# Patient Record
Sex: Male | Born: 1945
Health system: Southern US, Community
[De-identification: ages and names within clinical notes are randomized; demographics above are authoritative.]

## PROBLEM LIST (undated history)

## (undated) DIAGNOSIS — N400 Enlarged prostate without lower urinary tract symptoms: Secondary | ICD-10-CM

## (undated) DIAGNOSIS — E785 Hyperlipidemia, unspecified: Secondary | ICD-10-CM

## (undated) DIAGNOSIS — F4310 Post-traumatic stress disorder, unspecified: Secondary | ICD-10-CM

## (undated) DIAGNOSIS — K5792 Diverticulitis of intestine, part unspecified, without perforation or abscess without bleeding: Secondary | ICD-10-CM

## (undated) DIAGNOSIS — G4700 Insomnia, unspecified: Secondary | ICD-10-CM

## (undated) DIAGNOSIS — G4730 Sleep apnea, unspecified: Secondary | ICD-10-CM

## (undated) HISTORY — PX: OTHER SURGICAL HISTORY: SHX169

## (undated) HISTORY — PX: FASCIOTOMY: SHX132

## (undated) HISTORY — DX: Diverticulitis of intestine, part unspecified, without perforation or abscess without bleeding: K57.92

## (undated) HISTORY — PX: DENTAL SURGERY: SHX609

## (undated) HISTORY — PX: HERNIA REPAIR: SHX51

## (undated) HISTORY — PX: BLEPHAROPLASTY: SUR158

---
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
## 1980-06-30 DIAGNOSIS — M726 Necrotizing fasciitis: Secondary | ICD-10-CM | POA: Insufficient documentation

## 2011-03-04 ENCOUNTER — Ambulatory Visit: Payer: Self-pay | Admitting: Family Medicine

## 2011-05-10 ENCOUNTER — Emergency Department: Payer: Self-pay | Admitting: Emergency Medicine

## 2011-12-17 ENCOUNTER — Emergency Department: Payer: Self-pay | Admitting: Internal Medicine

## 2011-12-17 ENCOUNTER — Ambulatory Visit: Payer: Self-pay | Admitting: Family Medicine

## 2011-12-17 LAB — URINALYSIS, COMPLETE
Bilirubin,UR: NEGATIVE
Glucose,UR: NEGATIVE mg/dL (ref 0–75)
Ketone: NEGATIVE
Leukocyte Esterase: NEGATIVE
Protein: 30
RBC,UR: 2 /HPF (ref 0–5)
Specific Gravity: 1.029 (ref 1.003–1.030)
Squamous Epithelial: <1
WBC UR: 1 /HPF (ref 0–5)

## 2011-12-17 LAB — CBC WITH DIFFERENTIAL/PLATELET
Basophil #: 0 10*3/uL (ref 0.0–0.1)
Eosinophil #: 0 10*3/uL (ref 0.0–0.7)
Eosinophil %: 0.1 %
HCT: 43.3 % (ref 40.0–52.0)
HGB: 14.9 g/dL (ref 12.0–16.0)
Lymphocyte #: 0.3 10*3/uL — ABNORMAL LOW (ref 1.0–3.6)
Lymphocyte %: 5 %
MCH: 32 pg (ref 26.0–34.0)
Monocyte %: 6.7 %
Neutrophil %: 87.8 %
RBC: 4.66 10*6/uL (ref 4.40–5.90)
RDW: 14.2 % (ref 11.5–14.5)
WBC: 6 10*3/uL (ref 3.8–10.6)

## 2011-12-17 LAB — BASIC METABOLIC PANEL
Anion Gap: 8 (ref 7–16)
BUN: 20 mg/dL — ABNORMAL HIGH (ref 7–18)
Calcium, Total: 8.7 mg/dL (ref 8.5–10.1)
Creatinine: 1.39 mg/dL — ABNORMAL HIGH (ref 0.60–1.30)
Glucose: 121 mg/dL — ABNORMAL HIGH (ref 65–99)
Osmolality: 274 (ref 275–301)
Sodium: 135 mmol/L — ABNORMAL LOW (ref 136–145)

## 2011-12-17 LAB — HEPATIC FUNCTION PANEL A (ARMC)
Albumin: 3.6 g/dL (ref 3.4–5.0)
Alkaline Phosphatase: 74 U/L (ref 50–136)
Bilirubin, Direct: <0.1 mg/dL (ref 0.00–0.20)
SGPT (ALT): 50 U/L
Total Protein: 7.5 g/dL (ref 6.4–8.2)

## 2011-12-19 LAB — URINE CULTURE

## 2011-12-23 LAB — CULTURE, BLOOD (SINGLE)

## 2012-10-29 IMAGING — CR DG CHEST 1V
1 series · 1 of 1 positions shown · non-contrast
Comparison: none

REASON FOR EXAM: fever
COMMENTS:

PROCEDURE:     DXR - DXR CHEST 1 VIEWAP OR PA  - December 17, 2011  [DATE]
RESULT:     The lungs are adequately inflated. There is no focal infiltrate.
The cardiac silhouette is mildly enlarged. The central pulmonary vascularity
is prominent. I see no pleural effusion.

[ap]
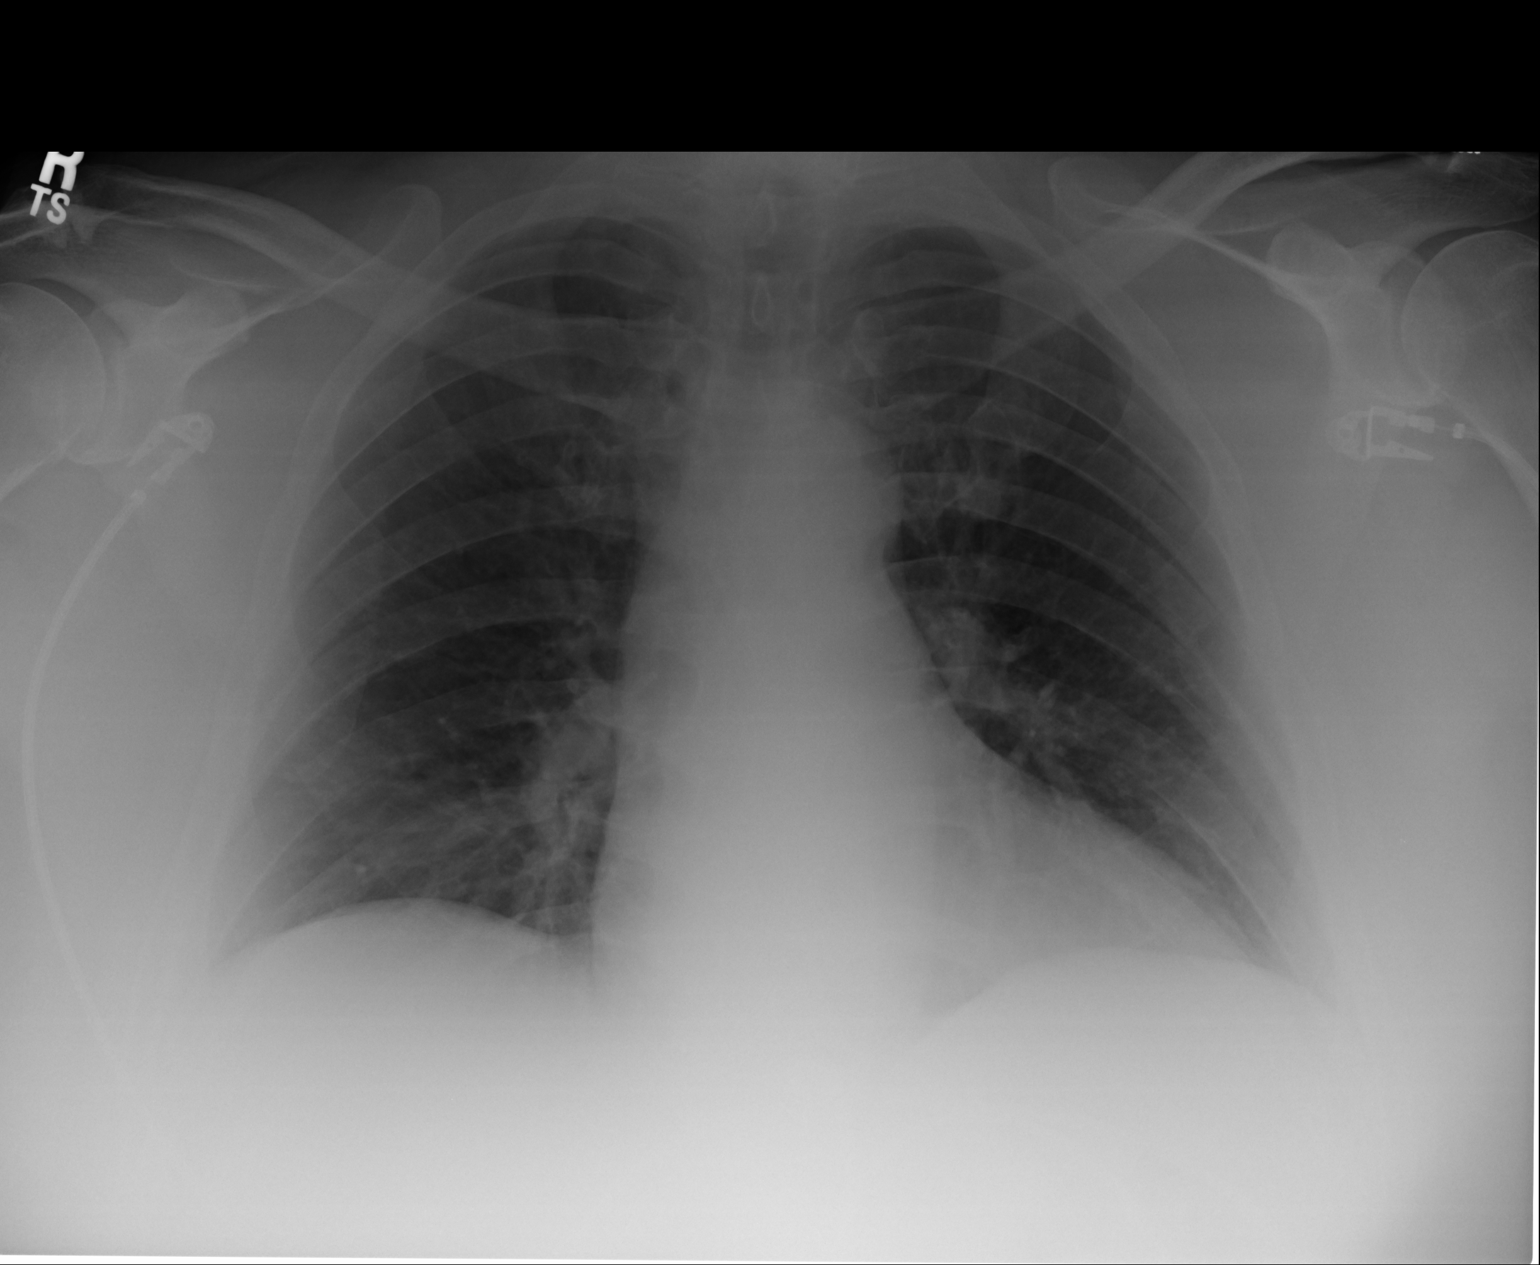

[1 of 1 positions shown; findings below may reference images not displayed]

IMPRESSION: I do not see evidence of pneumonia. I cannot exclude
low-grade CHF in the appropriate clinical setting. A followup PA and lateral
chest x-ray would be of value.

[REDACTED]

## 2017-05-23 ENCOUNTER — Emergency Department: Payer: Medicare HMO

## 2017-05-23 ENCOUNTER — Encounter: Payer: Self-pay | Admitting: Emergency Medicine

## 2017-05-23 ENCOUNTER — Observation Stay
Admission: EM | Admit: 2017-05-23 | Discharge: 2017-05-24 | Disposition: A | Discharge status: Home / Self Care | Payer: Medicare HMO | Attending: Internal Medicine | Admitting: Internal Medicine

## 2017-05-23 ENCOUNTER — Other Ambulatory Visit: Payer: Self-pay

## 2017-05-23 DIAGNOSIS — Z8249 Family history of ischemic heart disease and other diseases of the circulatory system: Secondary | ICD-10-CM | POA: Diagnosis not present

## 2017-05-23 DIAGNOSIS — R079 Chest pain, unspecified: Secondary | ICD-10-CM | POA: Diagnosis not present

## 2017-05-23 DIAGNOSIS — Z87891 Personal history of nicotine dependence: Secondary | ICD-10-CM | POA: Diagnosis not present

## 2017-05-23 DIAGNOSIS — G4700 Insomnia, unspecified: Secondary | ICD-10-CM | POA: Diagnosis not present

## 2017-05-23 DIAGNOSIS — N400 Enlarged prostate without lower urinary tract symptoms: Secondary | ICD-10-CM | POA: Insufficient documentation

## 2017-05-23 DIAGNOSIS — E785 Hyperlipidemia, unspecified: Secondary | ICD-10-CM | POA: Diagnosis not present

## 2017-05-23 DIAGNOSIS — Z6839 Body mass index (BMI) 39.0-39.9, adult: Secondary | ICD-10-CM | POA: Insufficient documentation

## 2017-05-23 DIAGNOSIS — E669 Obesity, unspecified: Secondary | ICD-10-CM | POA: Diagnosis not present

## 2017-05-23 DIAGNOSIS — I214 Non-ST elevation (NSTEMI) myocardial infarction: Secondary | ICD-10-CM | POA: Diagnosis not present

## 2017-05-23 DIAGNOSIS — R0789 Other chest pain: Secondary | ICD-10-CM | POA: Diagnosis not present

## 2017-05-23 DIAGNOSIS — Z7982 Long term (current) use of aspirin: Secondary | ICD-10-CM | POA: Insufficient documentation

## 2017-05-23 HISTORY — DX: Benign prostatic hyperplasia without lower urinary tract symptoms: N40.0

## 2017-05-23 HISTORY — DX: Insomnia, unspecified: G47.00

## 2017-05-23 HISTORY — DX: Hyperlipidemia, unspecified: E78.5

## 2017-05-23 LAB — BASIC METABOLIC PANEL
Anion gap: 7 (ref 5–15)
BUN: 16 mg/dL (ref 6–20)
CALCIUM: 9.2 mg/dL (ref 8.9–10.3)
CO2: 24 mmol/L (ref 22–32)
CREATININE: 1.23 mg/dL (ref 0.61–1.24)
Chloride: 107 mmol/L (ref 101–111)
GFR, EST NON AFRICAN AMERICAN: 57 mL/min — AB (ref >60)
Glucose, Bld: 115 mg/dL — ABNORMAL HIGH (ref 65–99)
Potassium: 4.3 mmol/L (ref 3.5–5.1)
SODIUM: 138 mmol/L (ref 135–145)

## 2017-05-23 LAB — CBC
HCT: 41.4 % (ref 40.0–52.0)
Hemoglobin: 14 g/dL (ref 13.0–18.0)
MCH: 31.8 pg (ref 26.0–34.0)
MCHC: 33.9 g/dL (ref 32.0–36.0)
MCV: 94 fL (ref 80.0–100.0)
PLATELETS: 231 10*3/uL (ref 150–440)
RBC: 4.4 MIL/uL (ref 4.40–5.90)
RDW: 13.6 % (ref 11.5–14.5)
WBC: 7.9 10*3/uL (ref 3.8–10.6)

## 2017-05-23 LAB — TROPONIN I
TROPONIN I: 0.03 ng/mL — AB (ref <0.03)
TROPONIN I: 0.03 ng/mL — AB (ref <0.03)

## 2017-05-23 LAB — FIBRIN DERIVATIVES D-DIMER (ARMC ONLY): Fibrin derivatives D-dimer (ARMC): 430.1 ng/mL (FEU) (ref 0.00–499.00)

## 2017-05-23 MED ORDER — LORATADINE 10 MG PO TABS: 10.0000 mg | ORAL_TABLET | Freq: Every day | ORAL | Status: DC | PRN

## 2017-05-23 MED ORDER — ASPIRIN EC 81 MG PO TBEC
81.0000 mg | DELAYED_RELEASE_TABLET | Freq: Every day | ORAL | Status: DC
  Administered 2017-05-24: 81 mg via ORAL
  Filled 2017-05-23: qty 1

## 2017-05-23 MED ORDER — ZOLPIDEM TARTRATE 5 MG PO TABS
5.0000 mg | ORAL_TABLET | Freq: Every day | ORAL | Status: DC
Start: 2017-05-23 — End: 2017-05-23

## 2017-05-23 MED ORDER — ACETAMINOPHEN 650 MG RE SUPP: 650.0000 mg | Freq: Four times a day (QID) | RECTAL | Status: DC | PRN

## 2017-05-23 MED ORDER — ASPIRIN 81 MG PO CHEW
324.0000 mg | CHEWABLE_TABLET | Freq: Once | ORAL | Status: AC
  Administered 2017-05-23: 324 mg via ORAL
  Filled 2017-05-23: qty 4

## 2017-05-23 MED ORDER — OXYCODONE HCL 5 MG PO TABS: 5.0000 mg | ORAL_TABLET | ORAL | Status: DC | PRN

## 2017-05-23 MED ORDER — ZOLPIDEM TARTRATE 5 MG PO TABS
5.0000 mg | ORAL_TABLET | Freq: Every day | ORAL | Status: DC
  Administered 2017-05-23: 5 mg via ORAL
  Filled 2017-05-23: qty 1

## 2017-05-23 MED ORDER — TAMSULOSIN HCL 0.4 MG PO CAPS
0.4000 mg | ORAL_CAPSULE | Freq: Every day | ORAL | Status: DC
  Administered 2017-05-23: 0.4 mg via ORAL
  Filled 2017-05-23: qty 1

## 2017-05-23 MED ORDER — ENOXAPARIN SODIUM 150 MG/ML ~~LOC~~ SOLN
1.0000 mg/kg | Freq: Once | SUBCUTANEOUS | Status: AC
  Administered 2017-05-23: 135 mg via SUBCUTANEOUS
  Filled 2017-05-23: qty 0.91

## 2017-05-23 MED ORDER — SODIUM CHLORIDE 0.9% FLUSH
3.0000 mL | Freq: Two times a day (BID) | INTRAVENOUS | Status: DC
  Administered 2017-05-23 – 2017-05-24 (×2): 3 mL via INTRAVENOUS

## 2017-05-23 MED ORDER — ACETAMINOPHEN 325 MG PO TABS: 650.0000 mg | ORAL_TABLET | Freq: Four times a day (QID) | ORAL | Status: DC | PRN

## 2017-05-23 MED ORDER — NITROGLYCERIN 0.4 MG SL SUBL
0.4000 mg | SUBLINGUAL_TABLET | Freq: Once | SUBLINGUAL | Status: AC
  Administered 2017-05-23: 0.4 mg via SUBLINGUAL
  Filled 2017-05-23: qty 1

## 2017-05-23 MED ORDER — MONTELUKAST SODIUM 10 MG PO TABS
10.0000 mg | ORAL_TABLET | Freq: Every day | ORAL | Status: DC
  Filled 2017-05-23: qty 1

## 2017-05-23 MED ORDER — KETOROLAC TROMETHAMINE 15 MG/ML IJ SOLN
15.0000 mg | Freq: Once | INTRAMUSCULAR | Status: AC
  Administered 2017-05-23: 15 mg via INTRAVENOUS
  Filled 2017-05-23: qty 1

## 2017-05-23 MED ORDER — NITROGLYCERIN 0.4 MG SL SUBL: 0.4000 mg | SUBLINGUAL_TABLET | SUBLINGUAL | Status: DC | PRN

## 2017-05-23 MED ORDER — FINASTERIDE 5 MG PO TABS
5.0000 mg | ORAL_TABLET | Freq: Every day | ORAL | Status: DC
  Filled 2017-05-23: qty 1

## 2017-05-23 NOTE — ED Notes (Signed)
Report received from [facility] - pt will be getting a 2nd troponin drawn at 1130. Pt resting comfortably in the room

## 2017-05-23 NOTE — ED Notes (Signed)
Cena BentonBarbara Hopkin (patient's wife)  [phone]

## 2017-05-23 NOTE — ED Notes (Signed)
Admitting dr in with pt  

## 2017-05-23 NOTE — Plan of Care (Signed)
No complaints of pain, will continue to assess and monitor.

## 2017-05-23 NOTE — H&P (Addendum)
[facility] [place] [facility]   PATIENT NAME: Brandon Wheeler    MR#:  [id number]  DATE OF BIRTH:  01-03-1946  DATE OF ADMISSION:  05/23/2017  PRIMARY CARE PHYSICIAN: Clare GandyEdelman, David, MD   REQUESTING/REFERRING PHYSICIAN: Dr Presley RaddleJohnathan Williams  CHIEF COMPLAINT:   Chief Complaint  Patient presents with  . Chest Pain    HISTORY OF PRESENT ILLNESS:  Brandon Wheeler  is a 71 y.o. male with a known history of hyperlipidemia.  He presented with chest pain that started this morning.  It has been constant since he has been here.  Described as sharp pain 8 out of 10 in intensity when he came in but eased off to about 4 out of 10 in intensity.  It hurts more when he moves or presses his left chest.  It feels like somebody hit him in the chest.  He feels a little lightheaded.  No nausea or vomiting.  No sweating.  No shortness of breath.  In the ER, his first troponin was less than 0.03.  His second troponin was 0.03.  He was bradycardic.  ER physician asked for observation.  Patient is usually very active and plays golf every day.  PAST MEDICAL HISTORY:   Past Medical History:  Diagnosis Date  . BPH (benign prostatic hyperplasia)   . Hyperlipidemia   . Insomnia     PAST SURGICAL HISTORY:   Past Surgical History:  Procedure Laterality Date  . FASCIOTOMY    . HERNIA REPAIR    . skin grafts      SOCIAL HISTORY:   Social History   Tobacco Use  . Smoking status: Former Games developermoker  . Smokeless tobacco: Never Used  Substance Use Topics  . Alcohol use: Yes    Comment: rare    FAMILY HISTORY:   Family History  Problem Relation Age of Onset  . Ovarian cancer Mother   . Hypertension Mother   . CAD Father     DRUG ALLERGIES:  No Known Allergies  REVIEW OF SYSTEMS:  CONSTITUTIONAL: No fever, fatigue or weakness.  EYES: No blurred or double vision.  Wears glasses.  Has small cataracts. EARS, NOSE, AND THROAT: No tinnitus or ear pain. No sore  throat RESPIRATORY: No cough, shortness of breath, wheezing or hemoptysis.  CARDIOVASCULAR: Some chest pain no . orthopnea, edema.  GASTROINTESTINAL: No nausea, vomiting, diarrhea or abdominal pain. No blood in bowel movements GENITOURINARY: No dysuria, hematuria.  ENDOCRINE: No polyuria, nocturia,  HEMATOLOGY: No anemia, easy bruising or bleeding SKIN: No rash or lesion. MUSCULOSKELETAL: Positive for joint pain NEUROLOGIC: No tingling, numbness, weakness.  PSYCHIATRY: No anxiety or depression.   MEDICATIONS AT HOME:   Prior to Admission medications   Medication Sig Start Date End Date Taking? Authorizing Provider  aspirin EC 81 MG tablet Take 81 mg by mouth daily.   Yes [provider]  zolpidem (AMBIEN) 5 MG tablet Take 5 mg by mouth at bedtime.   Yes [provider]   Patient also takes a cholesterol pill and a pill for his prostate. Medication reconciliation still undergoing.  Reproducible chest pain to palpation over the left upper chest.  VITAL SIGNS:  Blood pressure (!) 132/91, pulse (!) 50, temperature 97.8 F (36.6 C), temperature source Oral, resp. rate 15, height 6\' 1"  (1.854 m), weight (!) 136.5 kg (301 lb), SpO2 98 %.  PHYSICAL EXAMINATION:  GENERAL:  71 y.o.-year-old patient lying in the bed with no acute distress.  EYES: Pupils equal, round,  reactive to light and accommodation. No scleral icterus. Extraocular muscles intact.  HEENT: Head atraumatic, normocephalic. Oropharynx and nasopharynx clear.  NECK:  Supple, no jugular venous distention. No thyroid enlargement, no tenderness.  LUNGS: Normal breath sounds bilaterally, no wheezing, rales,rhonchi or crepitation. No use of accessory muscles of respiration.  CARDIOVASCULAR: S1, S2 normal. No murmurs, rubs, or gallops.  Reproducible chest pain to the left upper chest.  Patient also had pain when he moves his left arm. ABDOMEN: Soft, nontender, nondistended. Bowel sounds present. No organomegaly or  mass.  EXTREMITIES: Trace edema, no cyanosis, or clubbing.  NEUROLOGIC: Cranial nerves II through XII are intact. Muscle strength 5/5 in all extremities. Sensation intact. Gait not checked.  PSYCHIATRIC: The patient is alert and oriented x 3.  SKIN: No rash, lesion, or ulcer.   LABORATORY PANEL:   CBC Recent Labs  Lab 05/23/17 0939  WBC 7.9  HGB 14.0  HCT 41.4  PLT 231   ------------------------------------------------------------------------------------------------------------------  Chemistries  Recent Labs  Lab 05/23/17 0939  NA 138  K 4.3  CL 107  CO2 24  GLUCOSE 115*  BUN 16  CREATININE 1.23  CALCIUM 9.2   ------------------------------------------------------------------------------------------------------------------  Cardiac Enzymes Recent Labs  Lab 05/23/17 1128  TROPONINI 0.03*   ------------------------------------------------------------------------------------------------------------------  RADIOLOGY:  Dg Chest 2 View  Result Date: 05/23/2017 CLINICAL DATA:  71 year old male with a history of smoking EXAM: CHEST  2 VIEW COMPARISON:  None. FINDINGS: Cardiomediastinal silhouette unchanged. No evidence of central vascular congestion. No interlobular septal thickening. Coarsened interstitial markings similar to prior. No pneumothorax or pleural effusion. No displaced fracture IMPRESSION: No radiographic evidence of acute cardiopulmonary disease Electronically Signed   By: Gilmer MorJaime  Wagner D.O.   On: 05/23/2017 10:05    EKG:   Sinus bradycardia 44 bpm  IMPRESSION AND PLAN:   1.  Chest pain.  Will observe on telemetry.  Get a third cardiac enzyme.  Obtain echocardiogram.  Cardiology consultation.  Unfortunately no other testing can be done on Saturday afternoon or Sunday.  Continue aspirin.  Fiber and derivatives negatives are less likely pulmonary embolism.  I doubt aortic dissection. 2.  Hyperlipidemia unspecified check a lipid profile in the a.m. 3.   BPH on a medication for prostate. 4.  Insomnia on Ambien  All the records are reviewed and case discussed with ED provider. Management plans discussed with the patient, family and they are in agreement.  CODE STATUS: Full code  TOTAL TIME TAKING CARE OF THIS PATIENT: 50 minutes.    Alford Highlandichard Jaquetta Currier M.D on 05/23/2017 at 1:29 PM  Between 7am to 6pm - Pager - [phone]  After 6pm call admission pager [phone]  [facility] Office  [phone]  CC: Primary care physician; Clare GandyEdelman, David, MD

## 2017-05-23 NOTE — ED Notes (Signed)
Pt given lunch tray and is sitting at the side of the bed eating.

## 2017-05-23 NOTE — ED Notes (Signed)
Patient transported to X-ray 

## 2017-05-23 NOTE — ED Provider Notes (Addendum)
[facility] Emergency Department Provider Note       Time seen: ----------------------------------------- 9:39 AM on 05/23/2017 -----------------------------------------   I have reviewed the triage vital signs and the nursing notes.  HISTORY   Chief Complaint Chest Pain    HPI Brandon Wheeler is a 71 y.o. male with a history of hernia repair who presents to the ED for left-sided chest pain.  Patient reports around 8 AM he felt like someone punched him in the chest.  He is never felt this before, nothing makes it better.  He states it seems to come and go, it does worsen with deep breathing or certain movements.  He denies sweats, nausea or shortness of breath.  He was lightheaded.  History reviewed. No pertinent past medical history.  There are no active problems to display for this patient.   Past Surgical History:  Procedure Laterality Date  . FASCIOTOMY    . HERNIA REPAIR      Allergies Patient has no known allergies.  Social History Social History   Tobacco Use  . Smoking status: Former Games developermoker  . Smokeless tobacco: Never Used  Substance Use Topics  . Alcohol use: Yes    Comment: rare  . Drug use: Not on file    Review of Systems Constitutional: Negative for fever. Eyes: Negative for vision changes ENT:  Negative for congestion, sore throat Cardiovascular: Positive for chest pain Respiratory: Negative for shortness of breath Gastrointestinal: Negative for abdominal pain, vomiting and diarrhea. Genitourinary: Negative for dysuria. Musculoskeletal: Negative for back pain. Skin: Negative for rash. Neurological: Negative for headaches, focal weakness or numbness.  All systems negative/normal/unremarkable except as stated in the HPI  ____________________________________________   PHYSICAL EXAM:  VITAL SIGNS: ED Triage Vitals  Enc Vitals Group     BP 05/23/17 0935 (!) 146/64     Pulse Rate 05/23/17 0935 (!) 42     Resp  05/23/17 0935 (!) 22     Temp 05/23/17 0935 97.8 F (36.6 C)     Temp Source 05/23/17 0935 Oral     SpO2 05/23/17 0935 98 %     Weight 05/23/17 0929 (!) 301 lb (136.5 kg)     Height 05/23/17 0929 6\' 1"  (1.854 m)     Head Circumference --      Peak Flow --      Pain Score 05/23/17 0929 8     Pain Loc --      Pain Edu? --      Excl. in GC? --     Constitutional: Alert and oriented. Well appearing and in no distress. Eyes: Conjunctivae are normal. Normal extraocular movements. ENT   Head: Normocephalic and atraumatic.   Nose: No congestion/rhinnorhea.   Mouth/Throat: Mucous membranes are moist.   Neck: No stridor. Cardiovascular: Normal rate, regular rhythm. No murmurs, rubs, or gallops. Respiratory: Normal respiratory effort without tachypnea nor retractions. Breath sounds are clear and equal bilaterally. No wheezes/rales/rhonchi. Gastrointestinal: Soft and nontender. Normal bowel sounds Musculoskeletal: Nontender with normal range of motion in extremities. No lower extremity tenderness nor edema. Neurologic:  Normal speech and language. No gross focal neurologic deficits are appreciated.  Skin:  Skin is warm, dry and intact. No rash noted. Psychiatric: Mood and affect are normal. Speech and behavior are normal.  ____________________________________________  EKG: Interpreted by me.  Sinus bradycardia with a rate of 44 bpm, normal PR interval, normal QRS, normal QT.  ____________________________________________  ED COURSE:  Pertinent labs & imaging results that  were available during my care of the patient were reviewed by me and considered in my medical decision making (see chart for details). Patient presents for chest pain, we will assess with labs and imaging as indicated.   Procedures ____________________________________________   LABS (pertinent positives/negatives)  Labs Reviewed  BASIC METABOLIC PANEL - Abnormal; Notable for the following components:       Result Value   Glucose, Bld 115 (*)    GFR calc non Af Amer 57 (*)    All other components within normal limits  CBC  TROPONIN I  FIBRIN DERIVATIVES D-DIMER ([facility] ONLY)  TROPONIN I   CRITICAL CARE Performed by: Emily FilbertWilliams, Rita Vialpando E   Total critical care time: 30 minutes  Critical care time was exclusive of separately billable procedures and treating other patients.  Critical care was necessary to treat or prevent imminent or life-threatening deterioration.  Critical care was time spent personally by me on the following activities: development of treatment plan with patient and/or surrogate as well as nursing, discussions with consultants, evaluation of patient's response to treatment, examination of patient, obtaining history from patient or surrogate, ordering and performing treatments and interventions, ordering and review of laboratory studies, ordering and review of radiographic studies, pulse oximetry and re-evaluation of patient's condition.  RADIOLOGY Images were viewed by me  Chest x-ray IMPRESSION: No radiographic evidence of acute cardiopulmonary disease ____________________________________________  DIFFERENTIAL DIAGNOSIS   Unstable angina, MI, PE, pneumothorax, dissection, muscle strain, GERD, anxiety  FINAL ASSESSMENT AND PLAN  Chest pain   Plan: Patient had presented for sudden onset chest pain while he was drinking coffee this morning. Patient's labs were initially negative but repeat troponin was slightly elevated indicating he will need full cardiac evaluation and rule out.  He was given aspirin and a dose of nitroglycerin as well as shot of Lovenox.  Patient's imaging was negative for acute process.  I will discussed with the hospitalist for admission.   Emily FilbertWilliams, Journiee Feldkamp E, MD   Note: This note was generated in part or whole with voice recognition software. Voice recognition is usually quite accurate but there are transcription errors that can and  very often do occur. I apologize for any typographical errors that were not detected and corrected.     Emily FilbertWilliams, Biruk Troia E, MD 05/23/17 1243    Emily FilbertWilliams, Resa Rinks E, MD 05/23/17 1249

## 2017-05-23 NOTE — Plan of Care (Signed)
  Pain Managment: General experience of comfort will improve 05/23/2017 1811 - Progressing by Heath LarkKirkendall, Yaniris Braddock D, RN

## 2017-05-23 NOTE — ED Triage Notes (Addendum)
Patient to ER for c/o chest pain to left chest. States "It feels like someone punched me in the chest.". Patient arrives grabbing chest, ambulatory to stat desk. Patient denies any nausea, diaphoresis, or vomiting. +Light headedness and shortness of breath. Denies any cardiac history.

## 2017-05-24 DIAGNOSIS — R079 Chest pain, unspecified: Secondary | ICD-10-CM | POA: Diagnosis not present

## 2017-05-24 DIAGNOSIS — N400 Enlarged prostate without lower urinary tract symptoms: Secondary | ICD-10-CM | POA: Diagnosis not present

## 2017-05-24 DIAGNOSIS — G4700 Insomnia, unspecified: Secondary | ICD-10-CM | POA: Diagnosis not present

## 2017-05-24 DIAGNOSIS — E785 Hyperlipidemia, unspecified: Secondary | ICD-10-CM | POA: Diagnosis not present

## 2017-05-24 LAB — LIPID PANEL
Cholesterol: 93 mg/dL (ref 0–200)
HDL: 43 mg/dL (ref >40)
LDL CALC: 32 mg/dL (ref 0–99)
TRIGLYCERIDES: 90 mg/dL (ref <150)
Total CHOL/HDL Ratio: 2.2 RATIO
VLDL: 18 mg/dL (ref 0–40)

## 2017-05-24 LAB — CBC
HEMATOCRIT: 38.7 % — AB (ref 40.0–52.0)
Hemoglobin: 13.3 g/dL (ref 13.0–18.0)
MCH: 32.5 pg (ref 26.0–34.0)
MCHC: 34.4 g/dL (ref 32.0–36.0)
MCV: 94.5 fL (ref 80.0–100.0)
Platelets: 201 10*3/uL (ref 150–440)
RBC: 4.1 MIL/uL — ABNORMAL LOW (ref 4.40–5.90)
RDW: 13.5 % (ref 11.5–14.5)
WBC: 7.5 10*3/uL (ref 3.8–10.6)

## 2017-05-24 LAB — BASIC METABOLIC PANEL
Anion gap: 4 — ABNORMAL LOW (ref 5–15)
BUN: 20 mg/dL (ref 6–20)
CALCIUM: 8.6 mg/dL — AB (ref 8.9–10.3)
CO2: 28 mmol/L (ref 22–32)
Chloride: 106 mmol/L (ref 101–111)
Creatinine, Ser: 1.15 mg/dL (ref 0.61–1.24)
GFR calc Af Amer: >60 mL/min (ref >60)
GLUCOSE: 120 mg/dL — AB (ref 65–99)
POTASSIUM: 4.2 mmol/L (ref 3.5–5.1)
Sodium: 138 mmol/L (ref 135–145)

## 2017-05-24 NOTE — Plan of Care (Signed)
Na

## 2017-05-24 NOTE — Progress Notes (Signed)
Pt given discharge instructions. Tele off and IV out. Pt verbalized understanding with no further questions. Family at bedside, transported via wheelchair.

## 2017-05-24 NOTE — Consult Note (Addendum)
Primary Physician:  [facility]) Primary Cardiologist:  Asked to see by Dr Delano Metz for CP  HPI: Pt is a 71 yo with history of HL  Followed at [facility]   On a med, not sure what    Admittted yesterday with CP  Constant  Acute onset  Sharp  8/10   Worse with moving or pressing  No Fevers or chills  NO cough  Denies injury     Since yesterday pain has eased  Sill mild     FHX CAD father  Remote tobacco    Trop neg x 3   LDL 32  HDL 43  Total chol 93         Past Medical History:  Diagnosis Date  . BPH (benign prostatic hyperplasia)   . Hyperlipidemia   . Insomnia     Medications Prior to Admission  Medication Sig Dispense Refill  . aspirin EC 81 MG tablet Take 81 mg by mouth daily.    . cetirizine (ZYRTEC) 10 MG tablet Take 10 mg by mouth daily as needed for allergies.    . finasteride (PROSCAR) 5 MG tablet Take 5 mg by mouth daily.    . montelukast (SINGULAIR) 10 MG tablet Take 10 mg by mouth daily.    . tamsulosin (FLOMAX) 0.4 MG CAPS capsule Take 0.4 mg by mouth at bedtime.    Marland Kitchen zolpidem (AMBIEN) 10 MG tablet Take 10 mg by mouth at bedtime.        Marland Kitchen aspirin EC  81 mg Oral Daily  . finasteride  5 mg Oral Daily  . montelukast  10 mg Oral Daily  . sodium chloride flush  3 mL Intravenous Q12H  . tamsulosin  0.4 mg Oral QHS  . zolpidem  5 mg Oral QHS    Infusions:   No Known Allergies  Social History   Socioeconomic History  . Marital status: Married    Spouse name: Not on file  . Number of children: Not on file  . Years of education: Not on file  . Highest education level: Not on file  Social Needs  . Financial resource strain: Not on file  . Food insecurity - worry: Not on file  . Food insecurity - inability: Not on file  . Transportation needs - medical: Not on file  . Transportation needs - non-medical: Not on file  Occupational History  . Not on file  Tobacco Use  . Smoking status: Former Games developer  . Smokeless tobacco: Never Used    Substance and Sexual Activity  . Alcohol use: Yes    Comment: rare  . Drug use: No  . Sexual activity: Not on file  Other Topics Concern  . Not on file  Social History Narrative  . Not on file    Family History  Problem Relation Age of Onset  . Ovarian cancer Mother   . Hypertension Mother   . CAD Father     REVIEW OF SYSTEMS:  All systems reviewed  Negative to the above problem except as noted above.    PHYSICAL EXAM: Vitals:   05/24/17 0354 05/24/17 0811  BP: (!) 144/61 (!) 158/67  Pulse: (!) 48 (!) 49  Resp: 19 14  Temp: 98.1 F (36.7 C)   SpO2: 97% 98%     Intake/Output Summary (Last 24 hours) at 05/24/2017 1040 Last data filed at 05/24/2017 1029 Gross per 24 hour  Intake 363 ml  Output 800 ml  Net -437 ml  General:  Well appearing. No respiratory difficulty  Obese   HEENT: normal Neck: supple. no JVD. Carotids 2+ bilat; no bruits. No lymphadenopathy or thryomegaly appreciated. Cor: PMI nondisplaced. Regular rate & rhythm. No rubs, gallops or murmurs. Lungs: clear Abdomen: soft, nontender, nondistended. No hepatosplenomegaly. No bruits or masses. Good bowel sounds. Extremities: no cyanosis, clubbing, rash, edema Neuro: alert & oriented x 3, cranial nerves grossly intact. moves all 4 extremities w/o difficulty. Affect pleasant.  ECG:  Sinus bradycardia  44 bpm    Results for orders placed or performed during the hospital encounter of 05/23/17 (from the past 24 hour(s))  Troponin I     Status: Abnormal   Collection Time: 05/23/17 11:28 AM  Result Value Ref Range   Troponin I 0.03 (HH) <0.03 ng/mL  Fibrin derivatives D-Dimer ([facility] only)     Status: None   Collection Time: 05/23/17 11:28 AM  Result Value Ref Range   Fibrin derivatives D-dimer [facility]) 430.10 0.00 - 499.00 ng/mL (FEU)  Troponin I     Status: Abnormal   Collection Time: 05/23/17  3:24 PM  Result Value Ref Range   Troponin I 0.03 (HH) <0.03 ng/mL  Basic metabolic panel     Status:  Abnormal   Collection Time: 05/24/17  4:17 AM  Result Value Ref Range   Sodium 138 135 - 145 mmol/L   Potassium 4.2 3.5 - 5.1 mmol/L   Chloride 106 101 - 111 mmol/L   CO2 28 22 - 32 mmol/L   Glucose, Bld 120 (H) 65 - 99 mg/dL   BUN 20 6 - 20 mg/dL   Creatinine, Ser 1.191.15 0.61 - 1.24 mg/dL   Calcium 8.6 (L) 8.9 - 10.3 mg/dL   GFR calc non Af Amer >60 >60 mL/min   GFR calc Af Amer >60 >60 mL/min   Anion gap 4 (L) 5 - 15  CBC     Status: Abnormal   Collection Time: 05/24/17  4:17 AM  Result Value Ref Range   WBC 7.5 3.8 - 10.6 K/uL   RBC 4.10 (L) 4.40 - 5.90 MIL/uL   Hemoglobin 13.3 13.0 - 18.0 g/dL   HCT 14.738.7 (L) 82.940.0 - 56.252.0 %   MCV 94.5 80.0 - 100.0 fL   MCH 32.5 26.0 - 34.0 pg   MCHC 34.4 32.0 - 36.0 g/dL   RDW 13.013.5 86.511.5 - 78.414.5 %   Platelets 201 150 - 440 K/uL  Lipid panel     Status: None   Collection Time: 05/24/17  4:17 AM  Result Value Ref Range   Cholesterol 93 0 - 200 mg/dL   Triglycerides 90 <696<150 mg/dL   HDL 43 >29>40 mg/dL   Total CHOL/HDL Ratio 2.2 RATIO   VLDL 18 0 - 40 mg/dL   LDL Cholesterol 32 0 - 99 mg/dL   Dg Chest 2 View  Result Date: 05/23/2017 CLINICAL DATA:  71 year old male with a history of smoking EXAM: CHEST  2 VIEW COMPARISON:  None. FINDINGS: Cardiomediastinal silhouette unchanged. No evidence of central vascular congestion. No interlobular septal thickening. Coarsened interstitial markings similar to prior. No pneumothorax or pleural effusion. No displaced fracture IMPRESSION: No radiographic evidence of acute cardiopulmonary disease Electronically Signed   By: Gilmer MorJaime  Wagner D.O.   On: 05/23/2017 10:05     ASSESSMENT:  Pt is a 71 yo with history of HL by report on med Presents with CP  1.  CP  Atypical for anginia  Trop neg x 3  EKG neg  Probably  pleuritic or muscular in origin. Rx with pain meds, rest I would not plan any cardiac testing  D/c echo  2.  HL  [place] in [place]  Pt does not recall what med on   COtinue  3  Obesity  Discussed  wt loss  Encouraged more walking   OK to d/c from cardiac standpoint.

## 2017-05-24 NOTE — Plan of Care (Signed)
  Adequate for Discharge Education: Knowledge of General Education information will improve 05/24/2017 1239 - Adequate for Discharge by Erma HeritageAlejo Calderon, Desmund Elman, RN Health Behavior/Discharge Planning: Ability to manage health-related needs will improve 05/24/2017 1239 - Adequate for Discharge by Erma HeritageAlejo Calderon, Don Tiu, RN Clinical Measurements: Ability to maintain clinical measurements within normal limits will improve 05/24/2017 1239 - Adequate for Discharge by Weldon PickingAlejo Calderon, Manuella GhaziBerenice, RN Will remain free from infection 05/24/2017 1239 - Adequate for Discharge by Erma HeritageAlejo Calderon, Peytyn Trine, RN Diagnostic test results will improve 05/24/2017 1239 - Adequate for Discharge by Erma HeritageAlejo Calderon, Wayman Hoard, RN Respiratory complications will improve 05/24/2017 1239 - Adequate for Discharge by Erma HeritageAlejo Calderon, Zekiah Coen, RN Cardiovascular complication will be avoided 05/24/2017 1239 - Adequate for Discharge by Weldon PickingAlejo Calderon, Manuella GhaziBerenice, RN Activity: Risk for activity intolerance will decrease 05/24/2017 1239 - Adequate for Discharge by Erma HeritageAlejo Calderon, Laveah Gloster, RN Nutrition: Adequate nutrition will be maintained 05/24/2017 1239 - Adequate for Discharge by Erma HeritageAlejo Calderon, Linkin Vizzini, RN Coping: Level of anxiety will decrease 05/24/2017 1239 - Adequate for Discharge by Weldon PickingAlejo Calderon, Manuella GhaziBerenice, RN Elimination: Will not experience complications related to bowel motility 05/24/2017 1239 - Adequate for Discharge by Weldon PickingAlejo Calderon, Manuella GhaziBerenice, RN Will not experience complications related to urinary retention 05/24/2017 1239 - Adequate for Discharge by Erma HeritageAlejo Calderon, Darrall Strey, RN Pain Managment: General experience of comfort will improve 05/24/2017 1239 - Adequate for Discharge by Erma HeritageAlejo Calderon, London Nonaka, RN Safety: Ability to remain free from injury will improve 05/24/2017 1239 - Adequate for Discharge by Weldon PickingAlejo Calderon, Manuella GhaziBerenice, RN Skin Integrity: Risk for impaired skin integrity will  decrease 05/24/2017 1239 - Adequate for Discharge by Erma HeritageAlejo Calderon, Lawsen Arnott, RN Education: Understanding of cardiac disease, CV risk reduction, and recovery process will improve 05/24/2017 1239 - Adequate for Discharge by Erma HeritageAlejo Calderon, Dinesh Ulysse, RN Activity: Ability to tolerate increased activity will improve 05/24/2017 1239 - Adequate for Discharge by Erma HeritageAlejo Calderon, Cason Dabney, RN Cardiac: Ability to achieve and maintain adequate cardiovascular perfusion will improve 05/24/2017 1239 - Adequate for Discharge by Erma HeritageAlejo Calderon, Malayshia All, RN Health Behavior/Discharge Planning: Ability to safely manage health-related needs after discharge will improve 05/24/2017 1239 - Adequate for Discharge by Erma HeritageAlejo Calderon, Ravin Bendall, RN

## 2017-05-28 NOTE — Discharge Summary (Signed)
[facility] - [place] at [facility]   PATIENT NAME: Brandon Wheeler    MR#:  [id number]  DATE OF BIRTH:  February 28, 1946  DATE OF ADMISSION:  05/23/2017 ADMITTING PHYSICIAN: Alford Highlandichard Wieting, MD  DATE OF DISCHARGE: 05/24/2017  1:11 PM  PRIMARY CARE PHYSICIAN: Clare GandyEdelman, David, MD    ADMISSION DIAGNOSIS:  NSTEMI (non-ST elevated myocardial infarction) (HCC) [I21.4]  DISCHARGE DIAGNOSIS:  Active Problems:   Chest pain    Ruled out ACS.  SECONDARY DIAGNOSIS:   Past Medical History:  Diagnosis Date  . BPH (benign prostatic hyperplasia)   . Hyperlipidemia   . Insomnia     HOSPITAL COURSE:    Pt is a 71 yo with history of HL by report on med Presents with CP  1.  CP  Atypical for anginia  Trop neg x 3  EKG neg  Probably pleuritic or muscular in origin. Rx with pain meds, rest I would not plan any cardiac testing  D/c echo Appreciated cardio consult.  2.  HL  Followed in [place]  Pt does not recall what med on   COtinue  3  Obesity  Discussed wt loss  Encouraged more walking     DISCHARGE CONDITIONS:   Stable.  CONSULTS OBTAINED:  Treatment Team:  Pricilla Riffleoss, Paula V, MD  DRUG ALLERGIES:  No Known Allergies  DISCHARGE MEDICATIONS:   Allergies as of 05/24/2017   No Known Allergies     Medication List    TAKE these medications   aspirin EC 81 MG tablet Take 81 mg by mouth daily.   cetirizine 10 MG tablet Commonly known as:  ZYRTEC Take 10 mg by mouth daily as needed for allergies.   finasteride 5 MG tablet Commonly known as:  PROSCAR Take 5 mg by mouth daily.   montelukast 10 MG tablet Commonly known as:  SINGULAIR Take 10 mg by mouth daily.   tamsulosin 0.4 MG Caps capsule Commonly known as:  FLOMAX Take 0.4 mg by mouth at bedtime.   zolpidem 10 MG tablet Commonly known as:  AMBIEN Take 10 mg by mouth at bedtime.        DISCHARGE INSTRUCTIONS:    Follow with PMD in 1-2 weeks.  If you experience worsening of your  admission symptoms, develop shortness of breath, life threatening emergency, suicidal or homicidal thoughts you must seek medical attention immediately by calling 911 or calling your MD immediately  if symptoms less severe.  You Must read complete instructions/literature along with all the possible adverse reactions/side effects for all the Medicines you take and that have been prescribed to you. Take any new Medicines after you have completely understood and accept all the possible adverse reactions/side effects.   Please note  You were cared for by a hospitalist during your hospital stay. If you have any questions about your discharge medications or the care you received while you were in the hospital after you are discharged, you can call the unit and asked to speak with the hospitalist on call if the hospitalist that took care of you is not available. Once you are discharged, your primary care physician will handle any further medical issues. Please note that NO REFILLS for any discharge medications will be authorized once you are discharged, as it is imperative that you return to your primary care physician (or establish a relationship with a primary care physician if you do not have one) for your aftercare needs so that they can reassess your need for medications and  monitor your lab values.    Today   CHIEF COMPLAINT:   Chief Complaint  Patient presents with  . Chest Pain    HISTORY OF PRESENT ILLNESS:  Brandon Wheeler  is a 71 y.o. male with a known history of hyperlipidemia.  He presented with chest pain that started this morning.  It has been constant since he has been here.  Described as sharp pain 8 out of 10 in intensity when he came in but eased off to about 4 out of 10 in intensity.  It hurts more when he moves or presses his left chest.  It feels like somebody hit him in the chest.  He feels a little lightheaded.  No nausea or vomiting.  No sweating.  No shortness of breath.  In the  ER, his first troponin was less than 0.03.  His second troponin was 0.03.  He was bradycardic.  ER physician asked for observation.  Patient is usually very active and plays golf every day.   VITAL SIGNS:  Blood pressure (!) 121/96, pulse (!) 47, temperature 98.1 F (36.7 C), temperature source Oral, resp. rate 14, height 6\' 1"  (1.854 m), weight (!) 136.7 kg (301 lb 4.8 oz), SpO2 98 %.  I/O:  No intake or output data in the 24 hours ending 05/28/17 1354  PHYSICAL EXAMINATION:  GENERAL:  71 y.o.-year-old patient lying in the bed with no acute distress.  EYES: Pupils equal, round, reactive to light and accommodation. No scleral icterus. Extraocular muscles intact.  HEENT: Head atraumatic, normocephalic. Oropharynx and nasopharynx clear.  NECK:  Supple, no jugular venous distention. No thyroid enlargement, no tenderness.  LUNGS: Normal breath sounds bilaterally, no wheezing, rales,rhonchi or crepitation. No use of accessory muscles of respiration.  CARDIOVASCULAR: S1, S2 normal. No murmurs, rubs, or gallops.  ABDOMEN: Soft, non-tender, non-distended. Bowel sounds present. No organomegaly or mass.  EXTREMITIES: No pedal edema, cyanosis, or clubbing.  NEUROLOGIC: Cranial nerves II through XII are intact. Muscle strength 5/5 in all extremities. Sensation intact. Gait not checked.  PSYCHIATRIC: The patient is alert and oriented x 3.  SKIN: No obvious rash, lesion, or ulcer.   DATA REVIEW:   CBC Recent Labs  Lab 05/24/17 0417  WBC 7.5  HGB 13.3  HCT 38.7*  PLT 201    Chemistries  Recent Labs  Lab 05/24/17 0417  NA 138  K 4.2  CL 106  CO2 28  GLUCOSE 120*  BUN 20  CREATININE 1.15  CALCIUM 8.6*    Cardiac Enzymes Recent Labs  Lab 05/23/17 1524  TROPONINI 0.03*    Microbiology Results  No results found for this or any previous visit.  RADIOLOGY:  No results found.  EKG:   Orders placed or performed during the hospital encounter of 05/23/17  . EKG 12-Lead  . EKG  12-Lead  . ED EKG within 10 minutes  . ED EKG within 10 minutes  . EKG     Management plans discussed with the patient, family and they are in agreement.  CODE STATUS:  Code Status History    Date Active Date Inactive Code Status Order ID Comments User Context   05/23/2017 13:29 05/24/2017 16:16 Full Code [id number]  Alford Highland, MD ED      TOTAL TIME TAKING CARE OF THIS PATIENT: 35 minutes.    Altamese Dilling M.D on 05/28/2017 at 1:54 PM  Between 7am to 6pm - Pager - [phone]  After 6pm go to [email or web address] - password [email or web address]  [facility]  Office  [phone]  CC: Primary care physician; Clare GandyEdelman, David, MD   Note: This dictation was prepared with Dragon dictation along with smaller phrase technology. Any transcriptional errors that result from this process are unintentional.

## 2020-01-23 ENCOUNTER — Encounter: Payer: Self-pay | Admitting: Ophthalmology

## 2020-01-23 ENCOUNTER — Other Ambulatory Visit: Payer: Self-pay

## 2020-01-26 ENCOUNTER — Other Ambulatory Visit
Admission: RE | Admit: 2020-01-26 | Discharge: 2020-01-26 | Disposition: A | Discharge status: Home / Self Care | Payer: Medicare HMO | Source: Ambulatory Visit | Attending: Ophthalmology | Admitting: Ophthalmology

## 2020-01-26 ENCOUNTER — Other Ambulatory Visit: Payer: Self-pay

## 2020-01-26 DIAGNOSIS — Z01812 Encounter for preprocedural laboratory examination: Secondary | ICD-10-CM | POA: Insufficient documentation

## 2020-01-26 DIAGNOSIS — Z20822 Contact with and (suspected) exposure to covid-19: Secondary | ICD-10-CM | POA: Diagnosis not present

## 2020-01-26 LAB — SARS CORONAVIRUS 2 (TAT 6-24 HRS): SARS Coronavirus 2: NEGATIVE

## 2020-01-26 NOTE — Discharge Instructions (Signed)

## 2020-01-30 ENCOUNTER — Other Ambulatory Visit: Payer: Self-pay

## 2020-01-30 ENCOUNTER — Ambulatory Visit: Payer: No Typology Code available for payment source | Admitting: Anesthesiology

## 2020-01-30 ENCOUNTER — Encounter: Payer: Self-pay | Admitting: Ophthalmology

## 2020-01-30 ENCOUNTER — Encounter
Admission: RE | Disposition: A | Discharge status: Home / Self Care | Payer: Self-pay | Source: Home / Self Care | Attending: Ophthalmology

## 2020-01-30 ENCOUNTER — Ambulatory Visit
Admission: RE | Admit: 2020-01-30 | Discharge: 2020-01-30 | Disposition: A | Discharge status: Home / Self Care | Payer: No Typology Code available for payment source | Attending: Ophthalmology | Admitting: Ophthalmology

## 2020-01-30 DIAGNOSIS — Z6839 Body mass index (BMI) 39.0-39.9, adult: Secondary | ICD-10-CM | POA: Diagnosis not present

## 2020-01-30 DIAGNOSIS — Z79899 Other long term (current) drug therapy: Secondary | ICD-10-CM | POA: Diagnosis not present

## 2020-01-30 DIAGNOSIS — N400 Enlarged prostate without lower urinary tract symptoms: Secondary | ICD-10-CM | POA: Insufficient documentation

## 2020-01-30 DIAGNOSIS — Z87891 Personal history of nicotine dependence: Secondary | ICD-10-CM | POA: Diagnosis not present

## 2020-01-30 DIAGNOSIS — H2511 Age-related nuclear cataract, right eye: Secondary | ICD-10-CM | POA: Diagnosis not present

## 2020-01-30 DIAGNOSIS — F4310 Post-traumatic stress disorder, unspecified: Secondary | ICD-10-CM | POA: Diagnosis not present

## 2020-01-30 DIAGNOSIS — G4730 Sleep apnea, unspecified: Secondary | ICD-10-CM | POA: Insufficient documentation

## 2020-01-30 DIAGNOSIS — E669 Obesity, unspecified: Secondary | ICD-10-CM | POA: Insufficient documentation

## 2020-01-30 HISTORY — DX: Post-traumatic stress disorder, unspecified: F43.10

## 2020-01-30 HISTORY — PX: CATARACT EXTRACTION W/PHACO: SHX586

## 2020-01-30 HISTORY — DX: Sleep apnea, unspecified: G47.30

## 2020-01-30 SURGERY — PHACOEMULSIFICATION, CATARACT, WITH IOL INSERTION
Anesthesia: Monitor Anesthesia Care | Site: Eye | Laterality: Right

## 2020-01-30 MED ORDER — LIDOCAINE HCL (PF) 2 % IJ SOLN
INTRAOCULAR | Status: DC | PRN
  Administered 2020-01-30: 1 mL via INTRAOCULAR

## 2020-01-30 MED ORDER — LACTATED RINGERS IV SOLN: INTRAVENOUS | Status: DC

## 2020-01-30 MED ORDER — ACETAMINOPHEN 160 MG/5ML PO SOLN: 325.0000 mg | ORAL | Status: DC | PRN

## 2020-01-30 MED ORDER — ONDANSETRON HCL 4 MG/2ML IJ SOLN: 4.0000 mg | Freq: Once | INTRAMUSCULAR | Status: DC | PRN

## 2020-01-30 MED ORDER — SODIUM HYALURONATE 23 MG/ML IO SOLN
INTRAOCULAR | Status: DC | PRN
  Administered 2020-01-30: 0.6 mL via INTRAOCULAR

## 2020-01-30 MED ORDER — ARMC OPHTHALMIC DILATING DROPS
1.0000 "application " | OPHTHALMIC | Status: DC | PRN
  Administered 2020-01-30 (×3): 1 via OPHTHALMIC

## 2020-01-30 MED ORDER — MIDAZOLAM HCL 2 MG/2ML IJ SOLN
INTRAMUSCULAR | Status: DC | PRN
  Administered 2020-01-30: 1 mg via INTRAVENOUS

## 2020-01-30 MED ORDER — FENTANYL CITRATE (PF) 100 MCG/2ML IJ SOLN
INTRAMUSCULAR | Status: DC | PRN
  Administered 2020-01-30: 50 ug via INTRAVENOUS

## 2020-01-30 MED ORDER — SODIUM HYALURONATE 10 MG/ML IO SOLN
INTRAOCULAR | Status: DC | PRN
  Administered 2020-01-30: 0.55 mL via INTRAOCULAR

## 2020-01-30 MED ORDER — EPINEPHRINE PF 1 MG/ML IJ SOLN
INTRAOCULAR | Status: DC | PRN
  Administered 2020-01-30: 67 mL via OPHTHALMIC

## 2020-01-30 MED ORDER — ACETAMINOPHEN 325 MG PO TABS: 650.0000 mg | ORAL_TABLET | Freq: Once | ORAL | Status: DC | PRN

## 2020-01-30 MED ORDER — TETRACAINE HCL 0.5 % OP SOLN
1.0000 [drp] | OPHTHALMIC | Status: DC | PRN
  Administered 2020-01-30 (×3): 1 [drp] via OPHTHALMIC

## 2020-01-30 MED ORDER — MOXIFLOXACIN HCL 0.5 % OP SOLN
OPHTHALMIC | Status: DC | PRN
  Administered 2020-01-30: 0.2 mL via OPHTHALMIC

## 2020-01-30 SURGICAL SUPPLY — 20 items
CANNULA ANT/CHMB 27G (MISCELLANEOUS) ×2 IMPLANT
CANNULA ANT/CHMB 27GA (MISCELLANEOUS) ×6 IMPLANT
DISSECTOR HYDRO NUCLEUS 50X22 (MISCELLANEOUS) ×3 IMPLANT
GLOVE SURG LX 7.5 STRW (GLOVE) ×4
GLOVE SURG LX STRL 7.5 STRW (GLOVE) ×1 IMPLANT
GLOVE SURG SYN 8.5  E (GLOVE) ×2
GLOVE SURG SYN 8.5 E (GLOVE) ×1 IMPLANT
GLOVE SURG SYN 8.5 PF PI (GLOVE) ×1 IMPLANT
GOWN STRL REUS W/ TWL LRG LVL3 (GOWN DISPOSABLE) ×2 IMPLANT
GOWN STRL REUS W/TWL LRG LVL3 (GOWN DISPOSABLE) ×6
LENS IOL DIOP 22.5 (Intraocular Lens) ×3 IMPLANT
LENS IOL TECNIS MONO 22.5 (Intraocular Lens) IMPLANT
MARKER SKIN DUAL TIP RULER LAB (MISCELLANEOUS) ×3 IMPLANT
PACK DR. KING ARMS (PACKS) ×3 IMPLANT
PACK EYE AFTER SURG (MISCELLANEOUS) ×3 IMPLANT
PACK OPTHALMIC (MISCELLANEOUS) ×3 IMPLANT
SYR 3ML LL SCALE MARK (SYRINGE) ×3 IMPLANT
SYR TB 1ML LUER SLIP (SYRINGE) ×3 IMPLANT
WATER STERILE IRR 250ML POUR (IV SOLUTION) ×3 IMPLANT
WIPE NON LINTING 3.25X3.25 (MISCELLANEOUS) ×3 IMPLANT

## 2020-01-30 NOTE — Transfer of Care (Signed)
Immediate Anesthesia Transfer of Care Note  Patient: Brandon Wheeler  Procedure(s) Performed: CATARACT EXTRACTION PHACO AND INTRAOCULAR LENS PLACEMENT (IOC) RIGHT (Right Eye)  Patient Location: PACU  Anesthesia Type: MAC  Level of Consciousness: awake, alert  and patient cooperative  Airway and Oxygen Therapy: Patient Spontanous Breathing and Patient connected to supplemental oxygen  Post-op Assessment: Post-op Vital signs reviewed, Patient's Cardiovascular Status Stable, Respiratory Function Stable, Patent Airway and No signs of Nausea or vomiting  Post-op Vital Signs: Reviewed and stable  Complications: No complications documented.

## 2020-01-30 NOTE — H&P (Signed)

## 2020-01-30 NOTE — Op Note (Signed)
OPERATIVE NOTE  Brandon Wheeler [id number] 01/30/2020   PREOPERATIVE DIAGNOSIS:  Nuclear sclerotic cataract right eye.  H25.11   POSTOPERATIVE DIAGNOSIS:    Nuclear sclerotic cataract right eye.     PROCEDURE:  Phacoemusification with posterior chamber intraocular lens placement of the right eye   LENS:   Implant Name Type Inv. Item Serial No. Manufacturer Lot No. LRB No. Used Action  LENS IOL DIOP 22.5 - [id number] Intraocular Lens LENS IOL DIOP 22.5 [id number] [employer]  Right 1 Implanted       Procedure(s) with comments: CATARACT EXTRACTION PHACO AND INTRAOCULAR LENS PLACEMENT (IOC) RIGHT (Right) - 2.20 0:27.0  DCB00 +22.5   ULTRASOUND TIME: 0 minutes 27 seconds.  CDE 2.20   SURGEON:  Willey Blade, MD, MPH  ANESTHESIOLOGIST: Anesthesiologist: Reed Breech, MD CRNA: Michaele Offer, CRNA   ANESTHESIA:  Topical with tetracaine drops augmented with 1% preservative-free intracameral lidocaine.  ESTIMATED BLOOD LOSS: less than 1 mL.   COMPLICATIONS:  None.   DESCRIPTION OF PROCEDURE:  The patient was identified in the holding room and transported to the operating room and placed in the supine position under the operating microscope.  The right eye was identified as the operative eye and it was prepped and draped in the usual sterile ophthalmic fashion.   A 1.0 millimeter clear-corneal paracentesis was made at the 10:30 position. 0.5 ml of preservative-free 1% lidocaine with epinephrine was injected into the anterior chamber.  The anterior chamber was filled with Healon 5 viscoelastic.  A 2.4 millimeter keratome was used to make a near-clear corneal incision at the 8:00 position.  A curvilinear capsulorrhexis was made with a cystotome and capsulorrhexis forceps.  Balanced salt solution was used to hydrodissect and hydrodelineate the nucleus.   Phacoemulsification was then used in stop and chop fashion to remove the lens nucleus and epinucleus.  The remaining  cortex was then removed using the irrigation and aspiration handpiece. Healon was then placed into the capsular bag to distend it for lens placement.  A lens was then injected into the capsular bag.  The remaining viscoelastic was aspirated.   Wounds were hydrated with balanced salt solution.  The anterior chamber was inflated to a physiologic pressure with balanced salt solution.   Intracameral vigamox 0.1 mL undiluted was injected into the eye and a drop placed onto the ocular surface.  No wound leaks were noted.  The patient was taken to the recovery room in stable condition without complications of anesthesia or surgery  Willey Blade 01/30/2020, 12:16 PM

## 2020-01-30 NOTE — Anesthesia Preprocedure Evaluation (Addendum)
Anesthesia Evaluation  Patient identified by MRN, date of birth, ID band Patient awake    Reviewed: Allergy & Precautions, NPO status , Patient's Chart, lab work & pertinent test results  History of Anesthesia Complications Negative for: history of anesthetic complications  Airway Mallampati: III       Dental  (+) Edentulous Upper, Edentulous Lower   Pulmonary sleep apnea , former smoker (quit 1980),    Pulmonary exam normal breath sounds clear to auscultation       Cardiovascular Exercise Tolerance: Good negative cardio ROS Normal cardiovascular exam Rhythm:Regular Rate:Normal     Neuro/Psych PSYCHIATRIC DISORDERS (PTSD) negative neurological ROS     GI/Hepatic negative GI ROS,   Endo/Other  Obesity   Renal/GU negative Renal ROS     Musculoskeletal   Abdominal   Peds  Hematology negative hematology ROS (+)   Anesthesia Other Findings BPH  Reproductive/Obstetrics                            Anesthesia Physical Anesthesia Plan  ASA: II  Anesthesia Plan: MAC   Post-op Pain Management:    Induction: Intravenous  PONV Risk Score and Plan: 1 and TIVA, Treatment may vary due to age or medical condition and Midazolam  Airway Management Planned: Nasal Cannula  Additional Equipment:   Intra-op Plan:   Post-operative Plan:   Informed Consent: I have reviewed the patients History and Physical, chart, labs and discussed the procedure including the risks, benefits and alternatives for the proposed anesthesia with the patient or authorized representative who has indicated his/her understanding and acceptance.       Plan Discussed with: CRNA  Anesthesia Plan Comments:        Anesthesia Quick Evaluation

## 2020-01-30 NOTE — Anesthesia Procedure Notes (Signed)
Procedure Name: MAC Date/Time: 01/30/2020 11:56 AM Performed by: Silvana Newness, CRNA Pre-anesthesia Checklist: Patient identified, Emergency Drugs available, Suction available, Patient being monitored and Timeout performed Patient Re-evaluated:Patient Re-evaluated prior to induction Oxygen Delivery Method: Nasal cannula Placement Confirmation: positive ETCO2

## 2020-01-30 NOTE — Anesthesia Postprocedure Evaluation (Signed)
Anesthesia Post Note  Patient: Brandon Wheeler  Procedure(s) Performed: CATARACT EXTRACTION PHACO AND INTRAOCULAR LENS PLACEMENT (IOC) RIGHT (Right Eye)     Patient location during evaluation: PACU Anesthesia Type: MAC Level of consciousness: awake and alert, oriented and patient cooperative Pain management: pain level controlled Vital Signs Assessment: post-procedure vital signs reviewed and stable Respiratory status: spontaneous breathing, nonlabored ventilation and respiratory function stable Cardiovascular status: blood pressure returned to baseline and stable Postop Assessment: adequate PO intake Anesthetic complications: no   No complications documented.  Darrin Nipper

## 2020-01-31 ENCOUNTER — Encounter: Payer: Self-pay | Admitting: Ophthalmology

## 2020-03-15 ENCOUNTER — Encounter: Payer: Self-pay | Admitting: Ophthalmology

## 2020-03-22 ENCOUNTER — Other Ambulatory Visit
Admission: RE | Admit: 2020-03-22 | Discharge: 2020-03-22 | Disposition: A | Discharge status: Home / Self Care | Payer: No Typology Code available for payment source | Source: Ambulatory Visit | Attending: Ophthalmology | Admitting: Ophthalmology

## 2020-03-22 ENCOUNTER — Other Ambulatory Visit: Payer: Self-pay

## 2020-03-22 DIAGNOSIS — Z20822 Contact with and (suspected) exposure to covid-19: Secondary | ICD-10-CM | POA: Diagnosis not present

## 2020-03-22 DIAGNOSIS — Z01812 Encounter for preprocedural laboratory examination: Secondary | ICD-10-CM | POA: Diagnosis not present

## 2020-03-22 LAB — SARS CORONAVIRUS 2 (TAT 6-24 HRS): SARS Coronavirus 2: NEGATIVE

## 2020-03-22 NOTE — Discharge Instructions (Signed)

## 2020-03-25 NOTE — Anesthesia Preprocedure Evaluation (Addendum)
Anesthesia Evaluation  Patient identified by MRN, date of birth, ID band Patient awake    Reviewed: Allergy & Precautions, NPO status , Patient's Chart, lab work & pertinent test results  History of Anesthesia Complications Negative for: history of anesthetic complications  Airway Mallampati: III       Dental  (+) Edentulous Upper, Edentulous Lower   Pulmonary sleep apnea , former smoker,    Pulmonary exam normal breath sounds clear to auscultation       Cardiovascular Exercise Tolerance: Good negative cardio ROS Normal cardiovascular exam Rhythm:Regular Rate:Normal     Neuro/Psych PSYCHIATRIC DISORDERS (PTSD) negative neurological ROS     GI/Hepatic negative GI ROS,   Endo/Other  Obesity   Renal/GU negative Renal ROS     Musculoskeletal   Abdominal   Peds  Hematology negative hematology ROS (+)   Anesthesia Other Findings BPH  Reproductive/Obstetrics                             Anesthesia Physical  Anesthesia Plan  ASA: II  Anesthesia Plan: MAC   Post-op Pain Management:    Induction: Intravenous  PONV Risk Score and Plan: 1 and TIVA, Treatment may vary due to age or medical condition and Midazolam  Airway Management Planned: Nasal Cannula  Additional Equipment:   Intra-op Plan:   Post-operative Plan:   Informed Consent: I have reviewed the patients History and Physical, chart, labs and discussed the procedure including the risks, benefits and alternatives for the proposed anesthesia with the patient or authorized representative who has indicated his/her understanding and acceptance.       Plan Discussed with: CRNA  Anesthesia Plan Comments:        Anesthesia Quick Evaluation

## 2020-03-26 ENCOUNTER — Other Ambulatory Visit: Payer: Self-pay

## 2020-03-26 ENCOUNTER — Ambulatory Visit: Payer: No Typology Code available for payment source | Admitting: Anesthesiology

## 2020-03-26 ENCOUNTER — Encounter
Admission: RE | Disposition: A | Discharge status: Home / Self Care | Payer: Self-pay | Source: Home / Self Care | Attending: Ophthalmology

## 2020-03-26 ENCOUNTER — Encounter: Payer: Self-pay | Admitting: Ophthalmology

## 2020-03-26 ENCOUNTER — Ambulatory Visit
Admission: RE | Admit: 2020-03-26 | Discharge: 2020-03-26 | Disposition: A | Discharge status: Home / Self Care | Payer: No Typology Code available for payment source | Attending: Ophthalmology | Admitting: Ophthalmology

## 2020-03-26 DIAGNOSIS — H2512 Age-related nuclear cataract, left eye: Secondary | ICD-10-CM | POA: Insufficient documentation

## 2020-03-26 DIAGNOSIS — E669 Obesity, unspecified: Secondary | ICD-10-CM | POA: Diagnosis not present

## 2020-03-26 DIAGNOSIS — G4730 Sleep apnea, unspecified: Secondary | ICD-10-CM | POA: Insufficient documentation

## 2020-03-26 DIAGNOSIS — Z6839 Body mass index (BMI) 39.0-39.9, adult: Secondary | ICD-10-CM | POA: Diagnosis not present

## 2020-03-26 DIAGNOSIS — Z87891 Personal history of nicotine dependence: Secondary | ICD-10-CM | POA: Diagnosis not present

## 2020-03-26 HISTORY — PX: CATARACT EXTRACTION W/PHACO: SHX586

## 2020-03-26 SURGERY — PHACOEMULSIFICATION, CATARACT, WITH IOL INSERTION
Anesthesia: Monitor Anesthesia Care | Site: Eye | Laterality: Left

## 2020-03-26 MED ORDER — LACTATED RINGERS IV SOLN: INTRAVENOUS | Status: DC

## 2020-03-26 MED ORDER — SODIUM HYALURONATE 10 MG/ML IO SOLN
INTRAOCULAR | Status: DC | PRN
  Administered 2020-03-26: 0.55 mL via INTRAOCULAR

## 2020-03-26 MED ORDER — FENTANYL CITRATE (PF) 100 MCG/2ML IJ SOLN
INTRAMUSCULAR | Status: DC | PRN
  Administered 2020-03-26: 50 ug via INTRAVENOUS

## 2020-03-26 MED ORDER — TETRACAINE HCL 0.5 % OP SOLN
1.0000 [drp] | OPHTHALMIC | Status: DC | PRN
  Administered 2020-03-26 (×3): 1 [drp] via OPHTHALMIC

## 2020-03-26 MED ORDER — MIDAZOLAM HCL 2 MG/2ML IJ SOLN
INTRAMUSCULAR | Status: DC | PRN
  Administered 2020-03-26: 1 mg via INTRAVENOUS

## 2020-03-26 MED ORDER — LIDOCAINE HCL (PF) 2 % IJ SOLN
INTRAOCULAR | Status: DC | PRN
  Administered 2020-03-26: 1 mL via INTRAOCULAR

## 2020-03-26 MED ORDER — SODIUM HYALURONATE 23 MG/ML IO SOLN
INTRAOCULAR | Status: DC | PRN
  Administered 2020-03-26: 0.6 mL via INTRAOCULAR

## 2020-03-26 MED ORDER — EPINEPHRINE PF 1 MG/ML IJ SOLN
INTRAOCULAR | Status: DC | PRN
  Administered 2020-03-26: 73 mL via OPHTHALMIC

## 2020-03-26 MED ORDER — ARMC OPHTHALMIC DILATING DROPS
1.0000 "application " | OPHTHALMIC | Status: DC | PRN
  Administered 2020-03-26 (×3): 1 via OPHTHALMIC

## 2020-03-26 MED ORDER — MOXIFLOXACIN HCL 0.5 % OP SOLN
OPHTHALMIC | Status: DC | PRN
  Administered 2020-03-26: 0.2 mL via OPHTHALMIC

## 2020-03-26 SURGICAL SUPPLY — 20 items
CANNULA ANT/CHMB 27G (MISCELLANEOUS) ×2 IMPLANT
CANNULA ANT/CHMB 27GA (MISCELLANEOUS) ×6 IMPLANT
DISSECTOR HYDRO NUCLEUS 50X22 (MISCELLANEOUS) ×3 IMPLANT
GLOVE SURG LX 7.5 STRW (GLOVE) ×2
GLOVE SURG LX STRL 7.5 STRW (GLOVE) ×1 IMPLANT
GLOVE SURG SYN 8.5  E (GLOVE) ×2
GLOVE SURG SYN 8.5 E (GLOVE) ×1 IMPLANT
GLOVE SURG SYN 8.5 PF PI (GLOVE) ×1 IMPLANT
GOWN STRL REUS W/ TWL LRG LVL3 (GOWN DISPOSABLE) ×2 IMPLANT
GOWN STRL REUS W/TWL LRG LVL3 (GOWN DISPOSABLE) ×6
LENS IOL DIOP 22.5 (Intraocular Lens) ×3 IMPLANT
LENS IOL TECNIS MONO 22.5 (Intraocular Lens) IMPLANT
MARKER SKIN DUAL TIP RULER LAB (MISCELLANEOUS) ×3 IMPLANT
PACK DR. KING ARMS (PACKS) ×3 IMPLANT
PACK EYE AFTER SURG (MISCELLANEOUS) ×3 IMPLANT
PACK OPTHALMIC (MISCELLANEOUS) ×3 IMPLANT
SYR 3ML LL SCALE MARK (SYRINGE) ×3 IMPLANT
SYR TB 1ML LUER SLIP (SYRINGE) ×3 IMPLANT
WATER STERILE IRR 250ML POUR (IV SOLUTION) ×3 IMPLANT
WIPE NON LINTING 3.25X3.25 (MISCELLANEOUS) ×3 IMPLANT

## 2020-03-26 NOTE — Op Note (Signed)
OPERATIVE NOTE  Brandon Wheeler [id number] 03/26/2020   PREOPERATIVE DIAGNOSIS:  Nuclear sclerotic cataract left eye.  H25.12   POSTOPERATIVE DIAGNOSIS:    Nuclear sclerotic cataract left eye.     PROCEDURE:  Phacoemusification with posterior chamber intraocular lens placement of the left eye   LENS:  * No implants in log *    Procedure(s): CATARACT EXTRACTION PHACO AND INTRAOCULAR LENS PLACEMENT (IOC) LEFT 2.47  00:31.8 (Left)  DCB00 +22.5   ULTRASOUND TIME: 0 minutes 31 seconds.  CDE 2.47   SURGEON:  Willey Blade, MD, MPH   ANESTHESIA:  Topical with tetracaine drops augmented with 1% preservative-free intracameral lidocaine.  ESTIMATED BLOOD LOSS: <1 mL   COMPLICATIONS:  None.   DESCRIPTION OF PROCEDURE:  The patient was identified in the holding room and transported to the operating room and placed in the supine position under the operating microscope.  The left eye was identified as the operative eye and it was prepped and draped in the usual sterile ophthalmic fashion.   A 1.0 millimeter clear-corneal paracentesis was made at the 5:00 position. 0.5 ml of preservative-free 1% lidocaine with epinephrine was injected into the anterior chamber.  The anterior chamber was filled with Healon 5 viscoelastic.  A 2.4 millimeter keratome was used to make a near-clear corneal incision at the 2:00 position.  A curvilinear capsulorrhexis was made with a cystotome and capsulorrhexis forceps.  Balanced salt solution was used to hydrodissect and hydrodelineate the nucleus.   Phacoemulsification was then used in stop and chop fashion to remove the lens nucleus and epinucleus.  The remaining cortex was then removed using the irrigation and aspiration handpiece. Healon was then placed into the capsular bag to distend it for lens placement.  A lens was then injected into the capsular bag.  The remaining viscoelastic was aspirated.   Wounds were hydrated with balanced salt solution.  The anterior  chamber was inflated to a physiologic pressure with balanced salt solution.  Intracameral vigamox 0.1 mL undiltued was injected into the eye and a drop placed onto the ocular surface.  No wound leaks were noted.  The patient was taken to the recovery room in stable condition without complications of anesthesia or surgery  Willey Blade 03/26/2020, 1:48 PM

## 2020-03-26 NOTE — H&P (Signed)

## 2020-03-26 NOTE — Anesthesia Procedure Notes (Signed)
Procedure Name: MAC Date/Time: 03/26/2020 12:48 PM Performed by: Cameron Ali, CRNA Pre-anesthesia Checklist: Patient identified, Emergency Drugs available, Suction available, Timeout performed and Patient being monitored Patient Re-evaluated:Patient Re-evaluated prior to induction Oxygen Delivery Method: Nasal cannula Placement Confirmation: positive ETCO2

## 2020-03-26 NOTE — Anesthesia Postprocedure Evaluation (Signed)
Anesthesia Post Note  Patient: Brandon Wheeler  Procedure(s) Performed: CATARACT EXTRACTION PHACO AND INTRAOCULAR LENS PLACEMENT (IOC) LEFT 2.47  00:31.8 (Left Eye)     Patient location during evaluation: PACU Anesthesia Type: MAC Level of consciousness: awake and alert, oriented and patient cooperative Pain management: pain level controlled Vital Signs Assessment: post-procedure vital signs reviewed and stable Respiratory status: spontaneous breathing, nonlabored ventilation and respiratory function stable Cardiovascular status: blood pressure returned to baseline and stable Postop Assessment: adequate PO intake Anesthetic complications: no   No complications documented.  Darrin Nipper

## 2020-03-26 NOTE — Transfer of Care (Signed)
Immediate Anesthesia Transfer of Care Note  Patient: Brandon Wheeler  Procedure(s) Performed: CATARACT EXTRACTION PHACO AND INTRAOCULAR LENS PLACEMENT (IOC) LEFT 2.47  00:31.8 (Left Eye)  Patient Location: PACU  Anesthesia Type: MAC  Level of Consciousness: awake, alert  and patient cooperative  Airway and Oxygen Therapy: Patient Spontanous Breathing and Patient connected to supplemental oxygen  Post-op Assessment: Post-op Vital signs reviewed, Patient's Cardiovascular Status Stable, Respiratory Function Stable, Patent Airway and No signs of Nausea or vomiting  Post-op Vital Signs: Reviewed and stable  Complications: No complications documented.

## 2020-03-27 ENCOUNTER — Encounter: Payer: Self-pay | Admitting: Ophthalmology

## 2020-04-13 ENCOUNTER — Encounter: Payer: Self-pay | Admitting: Ophthalmology

## 2021-02-07 ENCOUNTER — Encounter: Payer: Self-pay | Admitting: Internal Medicine

## 2021-02-07 ENCOUNTER — Ambulatory Visit (INDEPENDENT_AMBULATORY_CARE_PROVIDER_SITE_OTHER): Payer: Medicare HMO | Admitting: Internal Medicine

## 2021-02-07 ENCOUNTER — Other Ambulatory Visit: Payer: Self-pay

## 2021-02-07 VITALS — BP 139/77 | HR 53 | Temp 98.3°F | Ht 70.67 in | Wt 299.4 lb

## 2021-02-07 DIAGNOSIS — Z0000 Encounter for general adult medical examination without abnormal findings: Secondary | ICD-10-CM

## 2021-02-07 DIAGNOSIS — Z6841 Body Mass Index (BMI) 40.0 and over, adult: Secondary | ICD-10-CM | POA: Diagnosis not present

## 2021-02-07 DIAGNOSIS — R011 Cardiac murmur, unspecified: Secondary | ICD-10-CM

## 2021-02-07 DIAGNOSIS — R42 Dizziness and giddiness: Secondary | ICD-10-CM | POA: Diagnosis not present

## 2021-02-07 LAB — URINALYSIS, ROUTINE W REFLEX MICROSCOPIC
Bilirubin, UA: NEGATIVE
Glucose, UA: NEGATIVE
Ketones, UA: NEGATIVE
Leukocytes,UA: NEGATIVE
Nitrite, UA: NEGATIVE
Specific Gravity, UA: >1.03 — ABNORMAL HIGH (ref 1.005–1.030)
Urobilinogen, Ur: 1 mg/dL (ref 0.2–1.0)
pH, UA: 5.5 (ref 5.0–7.5)

## 2021-02-07 LAB — MICROSCOPIC EXAMINATION
Bacteria, UA: NONE SEEN
Epithelial Cells (non renal): NONE SEEN /hpf (ref 0–10)
WBC, UA: NONE SEEN /hpf (ref 0–5)

## 2021-02-07 NOTE — Progress Notes (Signed)
BP 139/77   Pulse (!) 53   Temp 98.3 F (36.8 C) (Oral)   Ht 5' 10.67" (1.795 m)   Wt 299 lb 6.4 oz (135.8 kg)   SpO2 98%   BMI 42.15 kg/m    Subjective:    Patient ID: Brandon Wheeler, male    DOB: 03-16-1946, 75 y.o.   MRN: [id number]  Chief Complaint  Patient presents with   New Patient (Initial Visit)    HPI: Brandon Wheeler is a 75 y.o. male  Pt is here for a physical.   Pt has a cardiac murmer , SOB when exerting plays golf 3 times a week, feels sob when he walks around the golf course.  Dizziness This is a new problem. The current episode started more than 1 month ago (about 6 months, was seen recently @ [place] [facility]). The problem has been gradually worsening. Pertinent negatives include no abdominal pain, anorexia, change in bowel habit, fatigue, fever, headaches, joint swelling, rash, sore throat or urinary symptoms.   Chief Complaint  Patient presents with   New Patient (Initial Visit)    Relevant past medical, surgical, family and social history reviewed and updated as indicated. Interim medical history since our last visit reviewed. Allergies and medications reviewed and updated.  Review of Systems  Constitutional:  Negative for fatigue and fever.  HENT:  Negative for sore throat.   Gastrointestinal:  Negative for abdominal pain, anorexia and change in bowel habit.  Musculoskeletal:  Negative for joint swelling.  Skin:  Negative for rash.  Neurological:  Positive for dizziness. Negative for headaches.   Per HPI unless specifically indicated above     Objective:    BP 139/77   Pulse (!) 53   Temp 98.3 F (36.8 C) (Oral)   Ht 5' 10.67" (1.795 m)   Wt 299 lb 6.4 oz (135.8 kg)   SpO2 98%   BMI 42.15 kg/m   Wt Readings from Last 3 Encounters:  02/07/21 299 lb 6.4 oz (135.8 kg)  03/26/20 299 lb 4.8 oz (135.8 kg)  01/30/20 (!) 300 lb (136.1 kg)    Physical Exam Vitals and nursing note reviewed.  Constitutional:      General: He is not in acute  distress.    Appearance: Normal appearance. He is not ill-appearing or diaphoretic.  HENT:     Head: Normocephalic and atraumatic.     Right Ear: Tympanic membrane and external ear normal. There is no impacted cerumen.     Left Ear: External ear normal.     Nose: No congestion or rhinorrhea.     Mouth/Throat:     Pharynx: No oropharyngeal exudate or posterior oropharyngeal erythema.  Eyes:     Conjunctiva/sclera: Conjunctivae normal.     Pupils: Pupils are equal, round, and reactive to light.  Cardiovascular:     Rate and Rhythm: Normal rate and regular rhythm.     Heart sounds: No murmur heard.   No friction rub. No gallop.  Pulmonary:     Effort: No respiratory distress.     Breath sounds: No stridor. No wheezing or rhonchi.  Chest:     Chest wall: No tenderness.  Abdominal:     General: Abdomen is flat. Bowel sounds are normal.     Palpations: Abdomen is soft. There is no mass.     Tenderness: There is no abdominal tenderness.  Musculoskeletal:        General: Swelling present. No tenderness, deformity or signs of injury.  Cervical back: Normal range of motion and neck supple. No rigidity or tenderness.     Right lower leg: No edema.     Left lower leg: No edema.  Skin:    General: Skin is warm and dry.  Neurological:     Mental Status: He is alert.    Results for orders placed or performed during the hospital encounter of 03/22/20  SARS CORONAVIRUS 2 (TAT 6-24 HRS) Nasopharyngeal Nasopharyngeal Swab   Specimen: Nasopharyngeal Swab  Result Value Ref Range   SARS Coronavirus 2 NEGATIVE NEGATIVE        Current Outpatient Medications:    carboxymethylcellulose (REFRESH PLUS) 0.5 % SOLN, INSTILL 1 DROP IN EACH EYE EVERY 4 TO 6 HOURS AS NEEDED, Disp: , Rfl:    cetirizine (ZYRTEC) 10 MG tablet, Take 1 tablet by mouth at bedtime., Disp: , Rfl:    cyclobenzaprine (FLEXERIL) 10 MG tablet, Take 1 tablet by mouth daily., Disp: , Rfl:    Docusate Sodium (DSS) 100 MG CAPS,  TAKE ONE CAPSULE BY MOUTH TWO TIMES A DAY TO PREVENT CONSTIPATION, Disp: , Rfl:    finasteride (PROSCAR) 5 MG tablet, Take 1 tablet by mouth daily., Disp: , Rfl:    miconazole (MICOTIN) 2 % powder, APPLY MODERATE AMOUNT TOPICALLY TWO TIMES A DAY FOR FUNGAL INFECTION, Disp: , Rfl:    montelukast (SINGULAIR) 10 MG tablet, Take 1 tablet by mouth daily., Disp: , Rfl:    simvastatin (ZOCOR) 80 MG tablet, TAKE ONE-HALF TABLET BY MOUTH AT BEDTIME FOR CHOLESTEROL, Disp: , Rfl:    zolpidem (AMBIEN) 10 MG tablet, Take 10 mg by mouth at bedtime. , Disp: , Rfl:     Assessment & Plan:   HLD recheck FLP, check LFT's work on diet, SE of meds explained to pt. low fat and high fiber diet explained to pt.  BPH [facility]  Is pon finasteride for such.  Fu and mx per such   Cardiac murmer  / sob/ lower ext swelling will refer to cards for > ECHo ? Needs wu with such ? Sec to AS.  Problem List Items Addressed This Visit   None    No orders of the defined types were placed in this encounter.    No orders of the defined types were placed in this encounter.    Follow up plan: No follow-ups on file.

## 2021-02-08 LAB — COMPREHENSIVE METABOLIC PANEL
ALT: 17 IU/L (ref 0–44)
AST: 27 IU/L (ref 0–40)
Albumin/Globulin Ratio: 1.4 (ref 1.2–2.2)
Albumin: 4 g/dL (ref 3.7–4.7)
Alkaline Phosphatase: 74 IU/L (ref 44–121)
BUN/Creatinine Ratio: 15 (ref 10–24)
BUN: 17 mg/dL (ref 8–27)
Bilirubin Total: 0.3 mg/dL (ref 0.0–1.2)
CO2: 21 mmol/L (ref 20–29)
Calcium: 9.2 mg/dL (ref 8.6–10.2)
Chloride: 105 mmol/L (ref 96–106)
Creatinine, Ser: 1.15 mg/dL (ref 0.76–1.27)
Globulin, Total: 2.9 g/dL (ref 1.5–4.5)
Glucose: 121 mg/dL — ABNORMAL HIGH (ref 65–99)
Potassium: 4 mmol/L (ref 3.5–5.2)
Sodium: 141 mmol/L (ref 134–144)
Total Protein: 6.9 g/dL (ref 6.0–8.5)
eGFR: 66 mL/min/{1.73_m2} (ref >59)

## 2021-02-08 LAB — CBC WITH DIFFERENTIAL/PLATELET
Basophils Absolute: 0.1 10*3/uL (ref 0.0–0.2)
Basos: 1 %
EOS (ABSOLUTE): 0.3 10*3/uL (ref 0.0–0.4)
Eos: 4 %
Hematocrit: 40.7 % (ref 37.5–51.0)
Hemoglobin: 13.3 g/dL (ref 13.0–17.7)
Immature Grans (Abs): 0 10*3/uL (ref 0.0–0.1)
Immature Granulocytes: 0 %
Lymphocytes Absolute: 2.1 10*3/uL (ref 0.7–3.1)
Lymphs: 23 %
MCH: 31.4 pg (ref 26.6–33.0)
MCHC: 32.7 g/dL (ref 31.5–35.7)
MCV: 96 fL (ref 79–97)
Monocytes Absolute: 0.7 10*3/uL (ref 0.1–0.9)
Monocytes: 8 %
Neutrophils Absolute: 5.9 10*3/uL (ref 1.4–7.0)
Neutrophils: 64 %
Platelets: 230 10*3/uL (ref 150–450)
RBC: 4.24 x10E6/uL (ref 4.14–5.80)
RDW: 13.2 % (ref 11.6–15.4)
WBC: 9.2 10*3/uL (ref 3.4–10.8)

## 2021-02-08 LAB — TSH: TSH: 2.76 u[IU]/mL (ref 0.450–4.500)

## 2021-02-08 LAB — PSA: Prostate Specific Ag, Serum: <0.1 ng/mL (ref 0.0–4.0)

## 2021-03-20 NOTE — Progress Notes (Deleted)
Cardiology Office Note  Date:  03/20/2021   ID:  Brandon Wheeler, DOB 1946-01-29, MRN [id number]  PCP:  Loura Pardon, MD   No chief complaint on file.   HPI:  [name] is a 75 year old gentleman with past medical history of  PMH:   has a past medical history of BPH (benign prostatic hyperplasia), Hyperlipidemia, Insomnia, PTSD (post-traumatic stress disorder), and Sleep apnea.  PSH:    Past Surgical History:  Procedure Laterality Date   BLEPHAROPLASTY     CATARACT EXTRACTION W/PHACO Right 01/30/2020   Procedure: CATARACT EXTRACTION PHACO AND INTRAOCULAR LENS PLACEMENT (IOC) RIGHT;  Surgeon: Nevada Crane, MD;  Location: [facility];  Service: Ophthalmology;  Laterality: Right;  2.20 0:27.0   CATARACT EXTRACTION W/PHACO Left 03/26/2020   Procedure: CATARACT EXTRACTION PHACO AND INTRAOCULAR LENS PLACEMENT (IOC) LEFT 2.47  00:31.8;  Surgeon: Nevada Crane, MD;  Location: [facility];  Service: Ophthalmology;  Laterality: Left;   DENTAL SURGERY     Implants   FASCIOTOMY     HERNIA REPAIR     skin grafts      Current Outpatient Medications  Medication Sig Dispense Refill   carboxymethylcellulose (REFRESH PLUS) 0.5 % SOLN INSTILL 1 DROP IN EACH EYE EVERY 4 TO 6 HOURS AS NEEDED     cetirizine (ZYRTEC) 10 MG tablet Take 1 tablet by mouth at bedtime.     cyclobenzaprine (FLEXERIL) 10 MG tablet Take 1 tablet by mouth daily.     Docusate Sodium (DSS) 100 MG CAPS TAKE ONE CAPSULE BY MOUTH TWO TIMES A DAY TO PREVENT CONSTIPATION     finasteride (PROSCAR) 5 MG tablet Take 1 tablet by mouth daily.     miconazole (MICOTIN) 2 % powder APPLY MODERATE AMOUNT TOPICALLY TWO TIMES A DAY FOR FUNGAL INFECTION     montelukast (SINGULAIR) 10 MG tablet Take 1 tablet by mouth daily.     simvastatin (ZOCOR) 80 MG tablet TAKE ONE-HALF TABLET BY MOUTH AT BEDTIME FOR CHOLESTEROL     zolpidem (AMBIEN) 10 MG tablet Take 10 mg by mouth at bedtime.      No current  facility-administered medications for this visit.     Allergies:   Patient has no known allergies.   Social History:  The patient  reports that he quit smoking about 42 years ago. He has never used smokeless tobacco. He reports current alcohol use. He reports that he does not use drugs.   Family History:   family history includes CAD in his father; Hypertension in his mother; Ovarian cancer in his mother.    Review of Systems: ROS   PHYSICAL EXAM: VS:  There were no vitals taken for this visit. , BMI There is no height or weight on file to calculate BMI. GEN: Well nourished, well developed, in no acute distress HEENT: normal Neck: no JVD, carotid bruits, or masses Cardiac: RRR; no murmurs, rubs, or gallops,no edema  Respiratory:  clear to auscultation bilaterally, normal work of breathing GI: soft, nontender, nondistended, + BS MS: no deformity or atrophy Skin: warm and dry, no rash Neuro:  Strength and sensation are intact Psych: euthymic mood, full affect    Recent Labs: 02/07/2021: ALT 17; BUN 17; Creatinine, Ser 1.15; Hemoglobin 13.3; Platelets 230; Potassium 4.0; Sodium 141; TSH 2.760    Lipid Panel Lab Results  Component Value Date   CHOL 93 05/24/2017   HDL 43 05/24/2017   LDLCALC 32 05/24/2017   TRIG 90 05/24/2017  Wt Readings from Last 3 Encounters:  02/07/21 299 lb 6.4 oz (135.8 kg)  03/26/20 299 lb 4.8 oz (135.8 kg)  01/30/20 (!) 300 lb (136.1 kg)       ASSESSMENT AND PLAN:  Problem List Items Addressed This Visit   None    Disposition:   F/U  12 months   Total encounter time more than 25 minutes  Greater than 50% was spent in counseling and coordination of care with the patient    Signed, Dossie Arbour, M.D., Ph.D. [facility] [place], [place] [phone]

## 2021-03-21 ENCOUNTER — Ambulatory Visit: Payer: Medicare HMO | Admitting: Internal Medicine

## 2021-03-22 ENCOUNTER — Ambulatory Visit: Payer: No Typology Code available for payment source | Admitting: Cardiovascular Disease

## 2021-05-07 ENCOUNTER — Telehealth: Payer: Self-pay | Admitting: Internal Medicine

## 2021-05-07 NOTE — Telephone Encounter (Signed)
Left message for patient to call back and schedule Medicare Annual Wellness Visit (AWV) to be done virtually or by telephone.  No hx of AWV eligible as of 09/29/11  Please schedule at anytime with [facility] Health Advisor.      40 Minutes appointment   Any questions, please call me at [phone]

## 2021-05-28 DIAGNOSIS — Z7729 Contact with and (suspected ) exposure to other hazardous substances: Secondary | ICD-10-CM | POA: Insufficient documentation

## 2021-09-13 ENCOUNTER — Telehealth: Payer: Self-pay | Admitting: Internal Medicine

## 2021-09-13 NOTE — Telephone Encounter (Signed)
Copied from CRM [id number]. Topic: Medicare AWV ?>> Sep 13, 2021  2:20 PM Leigh Aurora wrote: ?Reason for CRM:  ?Left message for patient to call back and schedule Medicare Annual Wellness Visit (AWV) to be done virtually or by telephone. ? ?No hx of AWV eligible as of 09/29/11 ? ?Please schedule at anytime with [facility] Health Advisor.     ? ?45 Minutes appointment  ? ?Any questions, please call me at [phone] ?

## 2021-12-23 ENCOUNTER — Telehealth: Payer: Self-pay | Admitting: Internal Medicine

## 2022-06-30 DIAGNOSIS — R21 Rash and other nonspecific skin eruption: Secondary | ICD-10-CM | POA: Diagnosis not present

## 2023-03-03 ENCOUNTER — Ambulatory Visit (INDEPENDENT_AMBULATORY_CARE_PROVIDER_SITE_OTHER): Payer: Medicare HMO

## 2023-03-03 DIAGNOSIS — Z23 Encounter for immunization: Secondary | ICD-10-CM

## 2023-04-09 ENCOUNTER — Encounter: Payer: Self-pay | Admitting: Family Medicine

## 2023-04-09 ENCOUNTER — Ambulatory Visit: Payer: Medicare HMO | Admitting: Family Medicine

## 2023-04-09 VITALS — BP 135/79 | HR 78 | Ht 71.5 in | Wt 308.2 lb

## 2023-04-09 DIAGNOSIS — Z1159 Encounter for screening for other viral diseases: Secondary | ICD-10-CM

## 2023-04-09 DIAGNOSIS — Z6841 Body Mass Index (BMI) 40.0 and over, adult: Secondary | ICD-10-CM | POA: Diagnosis not present

## 2023-04-09 DIAGNOSIS — N400 Enlarged prostate without lower urinary tract symptoms: Secondary | ICD-10-CM | POA: Diagnosis not present

## 2023-04-09 DIAGNOSIS — E782 Mixed hyperlipidemia: Secondary | ICD-10-CM

## 2023-04-09 DIAGNOSIS — G4733 Obstructive sleep apnea (adult) (pediatric): Secondary | ICD-10-CM | POA: Diagnosis not present

## 2023-04-09 DIAGNOSIS — Z0000 Encounter for general adult medical examination without abnormal findings: Secondary | ICD-10-CM

## 2023-04-09 MED ORDER — MUPIROCIN 2 % EX OINT: 1.0000 | TOPICAL_OINTMENT | Freq: Two times a day (BID) | CUTANEOUS | 0 refills | Status: DC

## 2023-04-09 NOTE — Progress Notes (Signed)
BP 135/79   Pulse 78   Ht 5' 11.5" (1.816 m)   Wt (!) 308 lb 3.2 oz (139.8 kg)   SpO2 97%   BMI 42.39 kg/m    Subjective:    Patient ID: Brandon Wheeler, male    DOB: 07/09/1945, 76 y.o.   MRN: [id number]  HPI: Brandon Wheeler is a 77 y.o. male presenting on 04/09/2023 for comprehensive medical examination. Current medical complaints include:  HYPERLIPIDEMIA Hyperlipidemia status: excellent compliance Satisfied with current treatment?  yes Side effects:  no Medication compliance: excellent compliance Past cholesterol meds: simvastatin Supplements: none Aspirin:  no The ASCVD Risk score (Arnett DK, et al., 2019) failed to calculate for the following reasons:   The valid total cholesterol range is 130 to 320 mg/dL Chest pain:  no  BPH BPH status: stable Satisfied with current treatment?: yes Medication side effects: no Medication compliance: excellent compliance Duration: chronic Nocturia: 2-3x per night Urinary frequency:no Incomplete voiding: no Urgency: no Weak urinary stream: no Straining to start stream: no Dysuria: no Onset: gradual Severity: mild  Interim Problems from his last visit: no  Functional Status Survey: Is the patient deaf or have difficulty hearing?: No Does the patient have difficulty seeing, even when wearing glasses/contacts?: No Does the patient have difficulty concentrating, remembering, or making decisions?: No Does the patient have difficulty walking or climbing stairs?: Yes Does the patient have difficulty dressing or bathing?: No Does the patient have difficulty doing errands alone such as visiting a doctor's office or shopping?: No  FALL RISK:    04/09/2023    9:21 AM 02/07/2021    2:58 PM  Fall Risk   Falls in the past year? 0 0  Number falls in past yr: 0 0  Injury with Fall? 0 0  Risk for fall due to : No Fall Risks No Fall Risks  Follow up Falls evaluation completed Falls evaluation completed    Depression Screen     04/09/2023    9:21 AM 02/07/2021    2:58 PM  Depression screen PHQ 2/9  Decreased Interest 0 0  Down, Depressed, Hopeless 1 0  PHQ - 2 Score 1 0  Altered sleeping 0 0  Tired, decreased energy 2 0  Change in appetite 0 0  Feeling bad or failure about yourself  0 0  Trouble concentrating 0 0  Moving slowly or fidgety/restless 0 0  Suicidal thoughts 0 0  PHQ-9 Score 3 0  Difficult doing work/chores  Not difficult at all    Advanced Directives Does patient have a HCPOA?    no If yes, name and contact information:  Does patient have a living will or MOST form?  no  Past Medical History:  Past Medical History:  Diagnosis Date   BPH (benign prostatic hyperplasia)    Diverticulitis    Hyperlipidemia    Insomnia    PTSD (post-traumatic stress disorder)    Sleep apnea     Surgical History:  Past Surgical History:  Procedure Laterality Date   BLEPHAROPLASTY     CATARACT EXTRACTION W/PHACO Right 01/30/2020   Procedure: CATARACT EXTRACTION PHACO AND INTRAOCULAR LENS PLACEMENT (IOC) RIGHT;  Surgeon: Nevada Crane, MD;  Location: [facility];  Service: Ophthalmology;  Laterality: Right;  2.20 0:27.0   CATARACT EXTRACTION W/PHACO Left 03/26/2020   Procedure: CATARACT EXTRACTION PHACO AND INTRAOCULAR LENS PLACEMENT (IOC) LEFT 2.47  00:31.8;  Surgeon: Nevada Crane, MD;  Location: [facility];  Service: Ophthalmology;  Laterality: Left;   DENTAL SURGERY     Implants   FASCIOTOMY     HERNIA REPAIR     skin grafts      Medications:  Current Outpatient Medications on File Prior to Visit  Medication Sig   carboxymethylcellulose (REFRESH PLUS) 0.5 % SOLN INSTILL 1 DROP IN EACH EYE EVERY 4 TO 6 HOURS AS NEEDED   cetirizine (ZYRTEC) 10 MG tablet Take 1 tablet by mouth at bedtime.   clotrimazole (LOTRIMIN) 1 % cream Apply 1 Application topically 2 (two) times daily.   Docusate Sodium (DSS) 100 MG CAPS TAKE ONE CAPSULE BY MOUTH TWO TIMES A DAY TO PREVENT  CONSTIPATION   finasteride (PROSCAR) 5 MG tablet Take 1 tablet by mouth daily.   fluticasone (FLONASE) 50 MCG/ACT nasal spray Place into both nostrils daily.   miconazole (MICOTIN) 2 % powder APPLY MODERATE AMOUNT TOPICALLY TWO TIMES A DAY FOR FUNGAL INFECTION   montelukast (SINGULAIR) 10 MG tablet Take 1 tablet by mouth daily.   simvastatin (ZOCOR) 80 MG tablet TAKE ONE-HALF TABLET BY MOUTH AT BEDTIME FOR CHOLESTEROL   tamsulosin (FLOMAX) 0.4 MG CAPS capsule Take 0.4 mg by mouth.   zolpidem (AMBIEN) 10 MG tablet Take 10 mg by mouth at bedtime.    No current facility-administered medications on file prior to visit.    Allergies:  No Known Allergies  Social History:  Social History   Socioeconomic History   Marital status: Married    Spouse name: Not on file   Number of children: Not on file   Years of education: Not on file   Highest education level: Not on file  Occupational History   Not on file  Tobacco Use   Smoking status: Former    Current packs/day: 0.00    Types: Cigarettes    Quit date: 1980    Years since quitting: 44.8   Smokeless tobacco: Never  Vaping Use   Vaping status: Never Used  Substance and Sexual Activity   Alcohol use: Yes    Comment: rare   Drug use: No   Sexual activity: Not on file  Other Topics Concern   Not on file  Social History Narrative   Not on file   Social Determinants of Health   Financial Resource Strain: Not on file  Food Insecurity: Not on file  Transportation Needs: Not on file  Physical Activity: Not on file  Stress: Not on file  Social Connections: Not on file  Intimate Partner Violence: Not on file   Social History   Tobacco Use  Smoking Status Former   Current packs/day: 0.00   Types: Cigarettes   Quit date: 1980   Years since quitting: 44.8  Smokeless Tobacco Never   Social History   Substance and Sexual Activity  Alcohol Use Yes   Comment: rare    Family History:  Family History  Problem Relation  Age of Onset   Ovarian cancer Mother    Hypertension Mother    CAD Father     Past medical history, surgical history, medications, allergies, family history and social history reviewed with patient today and changes made to appropriate areas of the chart.   Review of Systems  Constitutional: Negative.   HENT:  Positive for hearing loss. Negative for congestion, ear discharge, ear pain, nosebleeds, sinus pain, sore throat and tinnitus.   Eyes:  Positive for blurred vision. Negative for double vision, photophobia, pain and discharge.  Respiratory: Negative.  Negative for stridor.   Cardiovascular:  Negative.   Gastrointestinal:  Positive for abdominal pain. Negative for blood in stool, constipation, diarrhea, heartburn, melena, nausea and vomiting.  Genitourinary: Negative.   Musculoskeletal:  Positive for myalgias. Negative for back pain, falls, joint pain and neck pain.  Skin: Negative.   Neurological: Negative.   Endo/Heme/Allergies: Negative.   Psychiatric/Behavioral:  Positive for depression. Negative for hallucinations, memory loss, substance abuse and suicidal ideas. The patient is not nervous/anxious and does not have insomnia.    All other ROS negative except what is listed above and in the HPI.      Objective:    BP 135/79   Pulse 78   Ht 5' 11.5" (1.816 m)   Wt (!) 308 lb 3.2 oz (139.8 kg)   SpO2 97%   BMI 42.39 kg/m   Wt Readings from Last 3 Encounters:  04/09/23 (!) 308 lb 3.2 oz (139.8 kg)  02/07/21 299 lb 6.4 oz (135.8 kg)  03/26/20 299 lb 4.8 oz (135.8 kg)     Physical Exam Vitals and nursing note reviewed.  Constitutional:      General: He is not in acute distress.    Appearance: Normal appearance. He is obese. He is not ill-appearing, toxic-appearing or diaphoretic.  HENT:     Head: Normocephalic and atraumatic.     Right Ear: Tympanic membrane, ear canal and external ear normal. There is no impacted cerumen.     Left Ear: Tympanic membrane, ear canal  and external ear normal. There is no impacted cerumen.     Nose: Nose normal. No congestion or rhinorrhea.     Mouth/Throat:     Mouth: Mucous membranes are moist.     Pharynx: Oropharynx is clear. No oropharyngeal exudate or posterior oropharyngeal erythema.  Eyes:     General: No scleral icterus.       Right eye: No discharge.        Left eye: No discharge.     Extraocular Movements: Extraocular movements intact.     Conjunctiva/sclera: Conjunctivae normal.     Pupils: Pupils are equal, round, and reactive to light.  Neck:     Vascular: No carotid bruit.  Cardiovascular:     Rate and Rhythm: Normal rate and regular rhythm.     Pulses: Normal pulses.     Heart sounds: Murmur heard.     No friction rub. No gallop.  Pulmonary:     Effort: Pulmonary effort is normal. No respiratory distress.     Breath sounds: Normal breath sounds. No stridor. No wheezing, rhonchi or rales.  Chest:     Chest wall: No tenderness.  Abdominal:     General: Abdomen is flat. Bowel sounds are normal. There is no distension.     Palpations: Abdomen is soft. There is no mass.     Tenderness: There is no abdominal tenderness. There is no right CVA tenderness, left CVA tenderness, guarding or rebound.     Hernia: No hernia is present.  Genitourinary:    Comments: Genital exam deferred with shared decision making Musculoskeletal:        General: No swelling, tenderness, deformity or signs of injury.     Cervical back: Normal range of motion and neck supple. No rigidity. No muscular tenderness.     Right lower leg: No edema.     Left lower leg: No edema.  Lymphadenopathy:     Cervical: No cervical adenopathy.  Skin:    General: Skin is warm and dry.     Capillary  Refill: Capillary refill takes less than 2 seconds.     Coloration: Skin is not jaundiced or pale.     Findings: No bruising, erythema, lesion or rash.  Neurological:     General: No focal deficit present.     Mental Status: He is alert and  oriented to person, place, and time.     Cranial Nerves: No cranial nerve deficit.     Sensory: No sensory deficit.     Motor: No weakness.     Coordination: Coordination normal.     Gait: Gait normal.     Deep Tendon Reflexes: Reflexes normal.  Psychiatric:        Mood and Affect: Mood normal.        Behavior: Behavior normal.        Thought Content: Thought content normal.        Judgment: Judgment normal.        04/09/2023    9:44 AM  6CIT Screen  What Year? 0 points  What month? 0 points  What time? 0 points  Count back from 20 0 points  Months in reverse 4 points  Repeat phrase 6 points  Total Score 10 points    Results for orders placed or performed in visit on 04/09/23  Comprehensive metabolic panel  Result Value Ref Range   Glucose 133 (H) 70 - 99 mg/dL   BUN 15 8 - 27 mg/dL   Creatinine, Ser 1.91 0.76 - 1.27 mg/dL   eGFR 60 >47 WG/NFA/2.13   BUN/Creatinine Ratio 12 10 - 24   Sodium 143 134 - 144 mmol/L   Potassium 4.4 3.5 - 5.2 mmol/L   Chloride 105 96 - 106 mmol/L   CO2 23 20 - 29 mmol/L   Calcium 9.0 8.6 - 10.2 mg/dL   Total Protein 6.9 6.0 - 8.5 g/dL   Albumin 3.7 (L) 3.8 - 4.8 g/dL   Globulin, Total 3.2 1.5 - 4.5 g/dL   Bilirubin Total 0.5 0.0 - 1.2 mg/dL   Alkaline Phosphatase 84 44 - 121 IU/L   AST 18 0 - 40 IU/L   ALT 12 0 - 44 IU/L  CBC with Differential/Platelet  Result Value Ref Range   WBC 6.5 3.4 - 10.8 x10E3/uL   RBC 4.45 4.14 - 5.80 x10E6/uL   Hemoglobin 13.9 13.0 - 17.7 g/dL   Hematocrit 08.6 57.8 - 51.0 %   MCV 96 79 - 97 fL   MCH 31.2 26.6 - 33.0 pg   MCHC 32.4 31.5 - 35.7 g/dL   RDW 46.9 62.9 - 52.8 %   Platelets 257 150 - 450 x10E3/uL   Neutrophils 63 Not Estab. %   Lymphs 24 Not Estab. %   Monocytes 7 Not Estab. %   Eos 5 Not Estab. %   Basos 1 Not Estab. %   Neutrophils Absolute 4.1 1.4 - 7.0 x10E3/uL   Lymphocytes Absolute 1.6 0.7 - 3.1 x10E3/uL   Monocytes Absolute 0.4 0.1 - 0.9 x10E3/uL   EOS (ABSOLUTE) 0.3 0.0 - 0.4  x10E3/uL   Basophils Absolute 0.1 0.0 - 0.2 x10E3/uL   Immature Granulocytes 0 Not Estab. %   Immature Grans (Abs) 0.0 0.0 - 0.1 x10E3/uL  Lipid Panel w/o Chol/HDL Ratio  Result Value Ref Range   Cholesterol, Total 113 100 - 199 mg/dL   Triglycerides 81 0 - 149 mg/dL   HDL 54 >41 mg/dL   VLDL Cholesterol Cal 16 5 - 40 mg/dL   LDL Chol Calc (NIH) 43  0 - 99 mg/dL  PSA  Result Value Ref Range   Prostate Specific Ag, Serum <0.1 0.0 - 4.0 ng/mL  TSH  Result Value Ref Range   TSH 3.210 0.450 - 4.500 uIU/mL  Hepatitis C Antibody  Result Value Ref Range   Hep C Virus Ab Non Reactive Non Reactive      Assessment & Plan:   Problem List Items Addressed This Visit       Respiratory   OSA (obstructive sleep apnea)    Continue CPAP. Continue to monitor. Call with any concerns.         Genitourinary   Benign prostatic hyperplasia    Under good control on current regimen. Continue current regimen. Continue to monitor. Call with any concerns. Refills through the [place]. Labs drawn today.        Relevant Medications   tamsulosin (FLOMAX) 0.4 MG CAPS capsule   Other Relevant Orders   PSA (Completed)     Other   Mixed hyperlipidemia    Under good control on current regimen. Continue current regimen. Continue to monitor. Call with any concerns. Refills through the [place]. Labs drawn today.       Relevant Orders   Comprehensive metabolic panel (Completed)   CBC with Differential/Platelet (Completed)   Lipid Panel w/o Chol/HDL Ratio (Completed)   TSH (Completed)   Other Visit Diagnoses     Encounter for Medicare annual wellness exam    -  Primary   Preventative care discussed today as below.   Routine general medical examination at a health care facility       Vaccines up to date. Screening labs checked today. Colonoscopy N/A. Continue diet and exercise. Call with any concerns. Continue to monitor.   Need for hepatitis C screening test       Labs drawn today. Await results.   Relevant  Orders   Hepatitis C Antibody (Completed)        Preventative Services:  Health Risk Assessment and Personalized Prevention Plan: Done today Bone Mass Measurements: N/A CVD Screening: Done today Colon Cancer Screening: N/A Depression Screening: Done today Diabetes Screening: Done today Glaucoma Screening: See your eye doctor Hepatitis B vaccine: N/A Hepatitis C screening: Done to day HIV Screening: N/A Flu Vaccine: Up to date Lung cancer Screening: N/A Obesity Screening: Done today Pneumonia Vaccines: Up to date STI Screening: N/A PSA screening: Done today   LABORATORY TESTING:  Health maintenance labs ordered today as discussed above.   The natural history of prostate cancer and ongoing controversy regarding screening and potential treatment outcomes of prostate cancer has been discussed with the patient. The meaning of a false positive PSA and a false negative PSA has been discussed. He indicates understanding of the limitations of this screening test and wishes to proceed with screening PSA testing.   IMMUNIZATIONS:   - Tdap: Tetanus vaccination status reviewed: Declined. - Influenza: Up to date - Pneumovax: Up to date - Prevnar: Up to date - Zostavax vaccine: Refused  SCREENING: - Colonoscopy: Not applicable  Discussed with patient purpose of the colonoscopy is to detect colon cancer at curable precancerous or early stages    PATIENT COUNSELING:    Sexuality: Discussed sexually transmitted diseases, partner selection, use of condoms, avoidance of unintended pregnancy  and contraceptive alternatives.   Advised to avoid cigarette smoking.  I discussed with the patient that most people either abstain from alcohol or drink within safe limits (<=14/week and <=4 drinks/occasion for males, <=7/weeks and <= 3 drinks/occasion  for females) and that the risk for alcohol disorders and other health effects rises proportionally with the number of drinks per week and how often a  drinker exceeds daily limits.  Discussed cessation/primary prevention of drug use and availability of treatment for abuse.   Diet: Encouraged to adjust caloric intake to maintain  or achieve ideal body weight, to reduce intake of dietary saturated fat and total fat, to limit sodium intake by avoiding high sodium foods and not adding table salt, and to maintain adequate dietary potassium and calcium preferably from fresh fruits, vegetables, and low-fat dairy products.    stressed the importance of regular exercise  Injury prevention: Discussed safety belts, safety helmets, smoke detector, smoking near bedding or upholstery.   Dental health: Discussed importance of regular tooth brushing, flossing, and dental visits.   Follow up plan: NEXT PREVENTATIVE PHYSICAL DUE IN 1 YEAR. Return in about 1 year (around 04/08/2024) for annual wellness/physical with me.

## 2023-04-09 NOTE — Patient Instructions (Signed)
Preventative Services:  Health Risk Assessment and Personalized Prevention Plan: Done today Bone Mass Measurements: N/A CVD Screening: Done today Colon Cancer Screening: N/A Depression Screening: Done today Diabetes Screening: Done today Glaucoma Screening: See your eye doctor Hepatitis B vaccine: N/A Hepatitis C screening: Done to day HIV Screening: N/A Flu Vaccine: Up to date Lung cancer Screening: N/A Obesity Screening: Done today Pneumonia Vaccines: Up to date STI Screening: N/A PSA screening: Done today

## 2023-04-10 LAB — COMPREHENSIVE METABOLIC PANEL
ALT: 12 [IU]/L (ref 0–44)
AST: 18 [IU]/L (ref 0–40)
Albumin: 3.7 g/dL — ABNORMAL LOW (ref 3.8–4.8)
Alkaline Phosphatase: 84 [IU]/L (ref 44–121)
BUN/Creatinine Ratio: 12 (ref 10–24)
BUN: 15 mg/dL (ref 8–27)
Bilirubin Total: 0.5 mg/dL (ref 0.0–1.2)
CO2: 23 mmol/L (ref 20–29)
Calcium: 9 mg/dL (ref 8.6–10.2)
Chloride: 105 mmol/L (ref 96–106)
Creatinine, Ser: 1.23 mg/dL (ref 0.76–1.27)
Globulin, Total: 3.2 g/dL (ref 1.5–4.5)
Glucose: 133 mg/dL — ABNORMAL HIGH (ref 70–99)
Potassium: 4.4 mmol/L (ref 3.5–5.2)
Sodium: 143 mmol/L (ref 134–144)
Total Protein: 6.9 g/dL (ref 6.0–8.5)
eGFR: 60 mL/min/{1.73_m2} (ref >59)

## 2023-04-10 LAB — CBC WITH DIFFERENTIAL/PLATELET
Basophils Absolute: 0.1 10*3/uL (ref 0.0–0.2)
Basos: 1 %
EOS (ABSOLUTE): 0.3 10*3/uL (ref 0.0–0.4)
Eos: 5 %
Hematocrit: 42.9 % (ref 37.5–51.0)
Hemoglobin: 13.9 g/dL (ref 13.0–17.7)
Immature Grans (Abs): 0 10*3/uL (ref 0.0–0.1)
Immature Granulocytes: 0 %
Lymphocytes Absolute: 1.6 10*3/uL (ref 0.7–3.1)
Lymphs: 24 %
MCH: 31.2 pg (ref 26.6–33.0)
MCHC: 32.4 g/dL (ref 31.5–35.7)
MCV: 96 fL (ref 79–97)
Monocytes Absolute: 0.4 10*3/uL (ref 0.1–0.9)
Monocytes: 7 %
Neutrophils Absolute: 4.1 10*3/uL (ref 1.4–7.0)
Neutrophils: 63 %
Platelets: 257 10*3/uL (ref 150–450)
RBC: 4.45 x10E6/uL (ref 4.14–5.80)
RDW: 12.9 % (ref 11.6–15.4)
WBC: 6.5 10*3/uL (ref 3.4–10.8)

## 2023-04-10 LAB — LIPID PANEL W/O CHOL/HDL RATIO
Cholesterol, Total: 113 mg/dL (ref 100–199)
HDL: 54 mg/dL (ref >39)
LDL Chol Calc (NIH): 43 mg/dL (ref 0–99)
Triglycerides: 81 mg/dL (ref 0–149)
VLDL Cholesterol Cal: 16 mg/dL (ref 5–40)

## 2023-04-10 LAB — TSH: TSH: 3.21 u[IU]/mL (ref 0.450–4.500)

## 2023-04-10 LAB — HEPATITIS C ANTIBODY: Hep C Virus Ab: NONREACTIVE

## 2023-04-10 LAB — PSA: Prostate Specific Ag, Serum: <0.1 ng/mL (ref 0.0–4.0)

## 2023-04-11 NOTE — Assessment & Plan Note (Signed)
Under good control on current regimen. Continue current regimen. Continue to monitor. Call with any concerns. Refills through the [place]. Labs drawn today.

## 2023-04-11 NOTE — Assessment & Plan Note (Signed)
Under good control on current regimen. Continue current regimen. Continue to monitor. Call with any concerns. [facility] Labs drawn today.   

## 2023-04-11 NOTE — Assessment & Plan Note (Signed)
Continue CPAP. Continue to monitor. Call with any concerns.

## 2023-04-12 ENCOUNTER — Encounter: Payer: Self-pay | Admitting: Family Medicine

## 2023-08-26 ENCOUNTER — Ambulatory Visit (INDEPENDENT_AMBULATORY_CARE_PROVIDER_SITE_OTHER): Payer: No Typology Code available for payment source

## 2023-08-26 ENCOUNTER — Encounter: Payer: Self-pay | Admitting: Emergency Medicine

## 2023-08-26 ENCOUNTER — Ambulatory Visit
Admission: EM | Admit: 2023-08-26 | Discharge: 2023-08-26 | Disposition: A | Discharge status: Home / Self Care | Payer: No Typology Code available for payment source | Attending: Emergency Medicine | Admitting: Emergency Medicine

## 2023-08-26 DIAGNOSIS — M19071 Primary osteoarthritis, right ankle and foot: Secondary | ICD-10-CM | POA: Diagnosis not present

## 2023-08-26 DIAGNOSIS — M25571 Pain in right ankle and joints of right foot: Secondary | ICD-10-CM

## 2023-08-26 DIAGNOSIS — M7731 Calcaneal spur, right foot: Secondary | ICD-10-CM

## 2023-08-26 MED ORDER — METHYLPREDNISOLONE 4 MG PO TBPK: ORAL_TABLET | ORAL | 0 refills | Status: DC

## 2023-08-26 MED ORDER — DEXAMETHASONE SODIUM PHOSPHATE 10 MG/ML IJ SOLN
10.0000 mg | Freq: Once | INTRAMUSCULAR | Status: AC
  Administered 2023-08-26: 10 mg via INTRAMUSCULAR

## 2023-08-26 NOTE — Discharge Instructions (Addendum)
 Your x-rays show that you have significant arthritis in your foot but does not show any evidence of broken bones.  It also shows that you have a heel spur.  We have given you an injection of steroids in clinic to help with pain and inflammation.  I have also prescribed you a Medrol Dosepak.  Start taking this tomorrow morning at breakfast time.  You will take it over a period of 6 days.  Follow the directions in the package for dosing.  Keep your right foot and ankle elevated is much as possible to help decrease pain and swelling.  You may apply ice to your foot and ankle for 20 minutes at a time, 2-3 times a day, as needed for pain and swelling.  If your symptoms do not improve, or they return, I recommend you follow-up with your primary care provider as you may need to be on long-term medication to help with arthritis.

## 2023-08-26 NOTE — ED Triage Notes (Signed)
 Pt present with right foot swelling and pain x 3 days.

## 2023-08-26 NOTE — ED Provider Notes (Signed)
[facility]    CSN: [id number] Arrival date & time: 08/26/23  1013      History   Chief Complaint Chief Complaint  Patient presents with   Foot Swelling    HPI Brandon Wheeler is a 78 y.o. male.   HPI  78 year old male with past medical history significant for sleep apnea on CPAP, PTSD, insomnia, hyperlipidemia, diverticulitis, BPH, who ambulates with a cane presents for evaluation of 3 days with the pain and swelling to his right foot.  He denies any injury.  He has no history of gout.  Denies any numbness or tingling.  Past Medical History:  Diagnosis Date   BPH (benign prostatic hyperplasia)    Diverticulitis    Hyperlipidemia    Insomnia    PTSD (post-traumatic stress disorder)    Sleep apnea     Patient Active Problem List   Diagnosis Date Noted   Mixed hyperlipidemia 04/09/2023   Benign prostatic hyperplasia 04/09/2023   OSA (obstructive sleep apnea) 04/09/2023    Past Surgical History:  Procedure Laterality Date   BLEPHAROPLASTY     CATARACT EXTRACTION W/PHACO Right 01/30/2020   Procedure: CATARACT EXTRACTION PHACO AND INTRAOCULAR LENS PLACEMENT (IOC) RIGHT;  Surgeon: Nevada Crane, MD;  Location: [facility];  Service: Ophthalmology;  Laterality: Right;  2.20 0:27.0   CATARACT EXTRACTION W/PHACO Left 03/26/2020   Procedure: CATARACT EXTRACTION PHACO AND INTRAOCULAR LENS PLACEMENT (IOC) LEFT 2.47  00:31.8;  Surgeon: Nevada Crane, MD;  Location: [facility];  Service: Ophthalmology;  Laterality: Left;   DENTAL SURGERY     Implants   FASCIOTOMY     HERNIA REPAIR     skin grafts         Home Medications    Prior to Admission medications   Medication Sig Start Date End Date Taking? Authorizing Provider  methylPREDNISolone (MEDROL DOSEPAK) 4 MG TBPK tablet Take according to the package insert. 08/26/23  Yes Becky Augusta, NP  carboxymethylcellulose (REFRESH PLUS) 0.5 % SOLN INSTILL 1 DROP IN [facility] EYE EVERY 4 TO 6 HOURS  AS NEEDED 06/26/20   [provider]  cetirizine (ZYRTEC) 10 MG tablet Take 1 tablet by mouth at bedtime. 02/20/20   [provider]  clotrimazole (LOTRIMIN) 1 % cream Apply 1 Application topically 2 (two) times daily.    [provider]  Docusate Sodium (DSS) 100 MG CAPS TAKE ONE CAPSULE BY MOUTH TWO TIMES A DAY TO PREVENT CONSTIPATION 06/26/20   [provider]  finasteride (PROSCAR) 5 MG tablet Take 1 tablet by mouth daily. 04/30/11   [provider]  fluticasone (FLONASE) 50 MCG/ACT nasal spray Place into both nostrils daily.    [provider]  miconazole (MICOTIN) 2 % powder APPLY MODERATE AMOUNT TOPICALLY TWO TIMES A DAY FOR FUNGAL INFECTION 06/26/20   [provider]  montelukast (SINGULAIR) 10 MG tablet Take 1 tablet by mouth daily. 02/20/20   [provider]  mupirocin ointment (BACTROBAN) 2 % Apply 1 Application topically 2 (two) times daily. 04/09/23   Johnson, Megan P, DO  simvastatin (ZOCOR) 80 MG tablet TAKE ONE-HALF TABLET BY MOUTH AT BEDTIME FOR CHOLESTEROL 03/30/20   [provider]  tamsulosin (FLOMAX) 0.4 MG CAPS capsule Take 0.4 mg by mouth.    [provider]  zolpidem (AMBIEN) 10 MG tablet Take 10 mg by mouth at bedtime.     [provider]    Family History Family History  Problem Relation Age of Onset  Ovarian cancer Mother    Hypertension Mother    CAD Father     Social History Social History   Tobacco Use   Smoking status: Former    Current packs/day: 0.00    Types: Cigarettes    Quit date: 1980    Years since quitting: 45.1   Smokeless tobacco: Never  Vaping Use   Vaping status: Never Used  Substance Use Topics   Alcohol use: Yes    Comment: rare   Drug use: No     Allergies   Patient has no known allergies.   Review of Systems Review of Systems  Musculoskeletal:  Positive for arthralgias and myalgias.  Skin:  Negative for color change.   Neurological:  Negative for weakness and numbness.     Physical Exam Triage Vital Signs ED Triage Vitals  Encounter Vitals Group     BP      Systolic BP Percentile      Diastolic BP Percentile      Pulse      Resp      Temp      Temp src      SpO2      Weight      Height      Head Circumference      Peak Flow      Pain Score      Pain Loc      Pain Education      Exclude from Growth Chart    No data found.  Updated Vital Signs BP 130/79 (BP Location: Right Arm)   Pulse 76   Temp 98.1 F (36.7 C) (Oral)   Resp 18   SpO2 95%   Visual Acuity Right Eye Distance:   Left Eye Distance:   Bilateral Distance:    Right Eye Near:   Left Eye Near:    Bilateral Near:     Physical Exam Vitals and nursing note reviewed.  Constitutional:      Appearance: Normal appearance. He is not ill-appearing.  HENT:     Head: Normocephalic and atraumatic.  Musculoskeletal:        General: Swelling and tenderness present. No signs of injury. Normal range of motion.  Skin:    General: Skin is warm and dry.     Capillary Refill: Capillary refill takes less than 2 seconds.     Findings: No bruising or erythema.  Neurological:     General: No focal deficit present.     Mental Status: He is alert and oriented to person, place, and time.      UC Treatments / Results  Labs (all labs ordered are listed, but only abnormal results are displayed) Labs Reviewed - No data to display  EKG   Radiology DG Foot Complete Right Result Date: 08/26/2023 CLINICAL DATA:  Pain over medial aspect for 3 days. No known injury. Right foot swelling and pain. EXAM: RIGHT FOOT COMPLETE - 3+ VIEW COMPARISON:  None Available. FINDINGS: Moderate hallux valgus. Mild great toe metatarsophalangeal joint space narrowing and peripheral osteophytosis. 10 x 12 mm degenerative subchondral cyst within the medial aspect of the great toe metatarsal head. Mild degenerative spurring at the medial naviculocuneiform  articulation. Small plantar and posterior calcaneal heel spurs. No acute fracture or dislocation. IMPRESSION: 1. Moderate hallux valgus and mild great toe metatarsophalangeal joint osteoarthritis. 2. Small plantar and posterior calcaneal heel spurs. Electronically Signed   By: Neita Garnet M.D.   On: 08/26/2023 12:08    Procedures  Procedures (including critical care time)  Medications Ordered in UC Medications  dexamethasone (DECADRON) injection 10 mg (has no administration in time range)    Initial Impression / Assessment and Plan / UC Course  I have reviewed the triage vital signs and the nursing notes.  Pertinent labs & imaging results that were available during my care of the patient were reviewed by me and considered in my medical decision making (see chart for details).   Pleasant, nontoxic-appearing 78 year old male presenting for evaluation of pain and swelling to the medial aspect of his right foot that started 3 days ago and is not associated with any known injury.  Above, the patient has swelling to the medial aspect of his foot anterior and inferior to the medial malleolus.  This area of swelling is tender to palpation.  The tarsal bones, metatarsals, phalanges, arch, and calcaneus are all free of tenderness to palpation.  Patient has full sensation and range of motion of his toes as well a lotion of his right foot and ankle.  I will obtain a radiograph of his right foot to rule out any bony abnormality.  Right foot x-rays independently reviewed and evaluated by me.  Impression: Patient has significant degenerative changes throughout the midfoot and ankle joint.  There is a radiopaque lucency on the medial aspect adjacent to the calcaneus but it has smooth edges and does not appear to be new.  There is soft tissue swelling present.  Patient also has a plantar calcaneal spur.  Radiology overread is pending. Radiology impression states moderate hallux valgus with mild great toe  metatarsophalangeal joint osteoarthritis.  Small plantar and posterior calcaneal heel spurs.  I will discharge patient on the diagnosis of osteoarthritis of his right foot.  I will have staff administer 2 mg of IM Decadron in clinic and discharge him home with a Medrol Dosepak to help with pain and swelling.  He needs to follow-up with his primary care provider for ongoing management if his symptoms continue.    Final Clinical Impressions(s) / UC Diagnoses   Final diagnoses:  Pain in joint involving right ankle and foot  Osteoarthritis of right foot, unspecified osteoarthritis type  Heel spur, right     Discharge Instructions      Your x-rays show that you have significant arthritis in your foot but does not show any evidence of broken bones.  It also shows that you have a heel spur.  We have given you an injection of steroids in clinic to help with pain and inflammation.  I have also prescribed you a Medrol Dosepak.  Start taking this tomorrow morning at breakfast time.  You will take it over a period of 6 days.  Follow the directions in the package for dosing.  Keep your right foot and ankle elevated is much as possible to help decrease pain and swelling.  You may apply ice to your foot and ankle for 20 minutes at a time, 2-3 times a day, as needed for pain and swelling.  If your symptoms do not improve, or they return, I recommend you follow-up with your primary care provider as you may need to be on long-term medication to help with arthritis.     ED Prescriptions     Medication Sig Dispense Auth. Provider   methylPREDNISolone (MEDROL DOSEPAK) 4 MG TBPK tablet Take according to the package insert. 1 each Becky Augusta, NP      PDMP not reviewed this encounter.   Becky Augusta, NP 08/26/23  1214  

## 2023-11-11 ENCOUNTER — Encounter: Payer: Self-pay | Admitting: Emergency Medicine

## 2023-11-11 ENCOUNTER — Ambulatory Visit
Admission: EM | Admit: 2023-11-11 | Discharge: 2023-11-11 | Disposition: A | Discharge status: Home / Self Care | Attending: Family Medicine | Admitting: Family Medicine

## 2023-11-11 DIAGNOSIS — S71109A Unspecified open wound, unspecified thigh, initial encounter: Secondary | ICD-10-CM | POA: Diagnosis not present

## 2023-11-11 DIAGNOSIS — Z8739 Personal history of other diseases of the musculoskeletal system and connective tissue: Secondary | ICD-10-CM

## 2023-11-11 MED ORDER — DOXYCYCLINE HYCLATE 100 MG PO CAPS: 100.0000 mg | ORAL_CAPSULE | Freq: Two times a day (BID) | ORAL | 0 refills | Status: DC

## 2023-11-11 NOTE — ED Triage Notes (Signed)
 Pt presents with a sore on his left thigh x 1 week. He has been using neosporin on the area for relief.

## 2023-11-11 NOTE — Discharge Instructions (Addendum)
 Stop by the pharmacy to pick up your prescriptions.  Follow up with your primary care provider or return to the urgent care, if not improving.   Call the Wound Healing Center at [facility] to schedule an appointment or follow up with a [facility].   Address: [address], [place], [place] [address] Phone: [phone]

## 2023-11-11 NOTE — ED Provider Notes (Signed)
 [facility]    CSN: [id number] Arrival date & time: 11/11/23  4098      History   Chief Complaint Chief Complaint  Patient presents with   Sore    HPI LASZLO FORNWALT is a 78 y.o. male.   HPI  Videl presents for sore on his inner left thigh that started a couple weeks ago. He had necrotizing fascitis in the 70s that required him to have surgery.   He doesn't want to have that happen again.  There area is painful to touch. No fever.     Past Medical History:  Diagnosis Date   BPH (benign prostatic hyperplasia)    Diverticulitis    Hyperlipidemia    Insomnia    PTSD (post-traumatic stress disorder)    Sleep apnea     Patient Active Problem List   Diagnosis Date Noted   Mixed hyperlipidemia 04/09/2023   Benign prostatic hyperplasia 04/09/2023   OSA (obstructive sleep apnea) 04/09/2023    Past Surgical History:  Procedure Laterality Date   BLEPHAROPLASTY     CATARACT EXTRACTION W/PHACO Right 01/30/2020   Procedure: CATARACT EXTRACTION PHACO AND INTRAOCULAR LENS PLACEMENT (IOC) RIGHT;  Surgeon: Rosa College, MD;  Location: [facility];  Service: Ophthalmology;  Laterality: Right;  2.20 0:27.0   CATARACT EXTRACTION W/PHACO Left 03/26/2020   Procedure: CATARACT EXTRACTION PHACO AND INTRAOCULAR LENS PLACEMENT (IOC) LEFT 2.47  00:31.8;  Surgeon: Rosa College, MD;  Location: [facility];  Service: Ophthalmology;  Laterality: Left;   DENTAL SURGERY     Implants   FASCIOTOMY     HERNIA REPAIR     skin grafts         Home Medications    Prior to Admission medications   Medication Sig Start Date End Date Taking? Authorizing Provider  doxycycline (VIBRAMYCIN) 100 MG capsule Take 1 capsule (100 mg total) by mouth 2 (two) times daily. 11/11/23  Yes Javid Kemler, DO  VALTREX 1 g tablet Take 1,000 mg by mouth 3 (three) times daily. 11/03/23  Yes [provider]  carboxymethylcellulose (REFRESH PLUS) 0.5 % SOLN INSTILL 1 DROP  IN EACH EYE EVERY 4 TO 6 HOURS AS NEEDED 06/26/20   [provider]  cetirizine (ZYRTEC) 10 MG tablet Take 1 tablet by mouth at bedtime. 02/20/20   [provider]  clotrimazole (LOTRIMIN) 1 % cream Apply 1 Application topically 2 (two) times daily.    [provider]  Docusate Sodium (DSS) 100 MG CAPS TAKE ONE CAPSULE BY MOUTH TWO TIMES A DAY TO PREVENT CONSTIPATION 06/26/20   [provider]  finasteride  (PROSCAR ) 5 MG tablet Take 1 tablet by mouth daily. 04/30/11   [provider]  fluticasone (FLONASE) 50 MCG/ACT nasal spray Place into both nostrils daily.    [provider]  miconazole (MICOTIN) 2 % powder APPLY MODERATE AMOUNT TOPICALLY TWO TIMES A DAY FOR FUNGAL INFECTION 06/26/20   [provider]  montelukast  (SINGULAIR ) 10 MG tablet Take 1 tablet by mouth daily. 02/20/20   [provider]  mupirocin  ointment (BACTROBAN ) 2 % Apply 1 Application topically 2 (two) times daily. 04/09/23   Johnson, Megan P, DO  simvastatin (ZOCOR) 80 MG tablet TAKE ONE-HALF TABLET BY MOUTH AT BEDTIME FOR CHOLESTEROL 03/30/20   [provider]  tamsulosin  (FLOMAX ) 0.4 MG CAPS capsule Take 0.4 mg by mouth.    [provider]  zolpidem  (AMBIEN ) 10 MG tablet Take 10 mg by mouth at bedtime.  [provider]    Family History Family History  Problem Relation Age of Onset   Ovarian cancer Mother    Hypertension Mother    CAD Father     Social History Social History   Tobacco Use   Smoking status: Former    Current packs/day: 0.00    Types: Cigarettes    Quit date: 1980    Years since quitting: 45.3   Smokeless tobacco: Never  Vaping Use   Vaping status: Never Used  Substance Use Topics   Alcohol use: Yes    Comment: rare   Drug use: No     Allergies   Patient has no known allergies.   Review of Systems Review of Systems :negative unless otherwise stated in HPI.      Physical Exam Triage  Vital Signs ED Triage Vitals  Encounter Vitals Group     BP 11/11/23 1003 (!) 156/92     Systolic BP Percentile --      Diastolic BP Percentile --      Pulse Rate 11/11/23 1003 79     Resp 11/11/23 1003 16     Temp 11/11/23 1003 97.8 F (36.6 C)     Temp Source 11/11/23 1003 Oral     SpO2 11/11/23 1003 96 %     Weight --      Height --      Head Circumference --      Peak Flow --      Pain Score 11/11/23 1002 0     Pain Loc --      Pain Education --      Exclude from Growth Chart --    No data found.  Updated Vital Signs BP (!) 156/92 (BP Location: Right Wrist)   Pulse 79   Temp 97.8 F (36.6 C) (Oral)   Resp 16   SpO2 96%   Visual Acuity Right Eye Distance:   Left Eye Distance:   Bilateral Distance:    Right Eye Near:   Left Eye Near:    Bilateral Near:     Physical Exam  GEN: alert, well appearing elderly male, in no acute distress  CV: regular rate  RESP: no increased work of breathing NEURO: alert, moves all extremities appropriately SKIN: warm and dry; left medial thigh with less than 1 cm wound in his surgical scar concerning for dehiscence    UC Treatments / Results  Labs (all labs ordered are listed, but only abnormal results are displayed) Labs Reviewed - No data to display  EKG   Radiology No results found.  Procedures Procedures (including critical care time)  Medications Ordered in UC Medications - No data to display  Initial Impression / Assessment and Plan / UC Course  I have reviewed the triage vital signs and the nursing notes.  Pertinent labs & imaging results that were available during my care of the patient were reviewed by me and considered in my medical decision making (see chart for details).     Patient is a 78 y.o. malewho has history of necrotizing fasciitis, hyperlipidemia, obstructive sleep apnea presents for left medial thigh wound.  Overall, patient is well-appearing and well-hydrated.  Vital signs stable.  Denis  is afebrile.  Exam concerning for infection versus wound dehiscence.  Treat with doxycycline as below.  Call the pharmacist and verify that he is not currently taking antibiotics he is taking Valtrex however.  Patient to follow-up with either the [facility] versus the  [place] wound care clinic in [place].  He sent a message to his primary care provider to discuss this while in the exam room.  If they do not follow-up with him he will go to the [facility] health wound care clinic.   Reviewed expectations regarding course of current medical issues.  All questions asked were answered.  Outlined signs and symptoms indicating need for more acute intervention. Patient verbalized understanding. After Visit Summary given.   Final Clinical Impressions(s) / UC Diagnoses   Final diagnoses:  Wound of thigh  History of necrotizing fasciitis     Discharge Instructions      Stop by the pharmacy to pick up your prescriptions.  Follow up with your primary care provider or return to the urgent care, if not improving.   Call the Wound Healing Center at [facility] to schedule an appointment or follow up with a [facility] wound [facility] provider.   Address: [address], [place], [place] [address] Phone: [phone]    ED Prescriptions     Medication Sig Dispense Auth. Provider   doxycycline (VIBRAMYCIN) 100 MG capsule Take 1 capsule (100 mg total) by mouth 2 (two) times daily. 20 capsule Shakeena Kafer, DO      PDMP not reviewed this encounter.              Fidel Huddle, DO 11/11/23 1426

## 2024-02-02 ENCOUNTER — Encounter: Attending: Physician Assistant | Admitting: Physician Assistant

## 2024-02-02 DIAGNOSIS — L02214 Cutaneous abscess of groin: Secondary | ICD-10-CM | POA: Diagnosis present

## 2024-02-02 DIAGNOSIS — L98492 Non-pressure chronic ulcer of skin of other sites with fat layer exposed: Secondary | ICD-10-CM | POA: Insufficient documentation

## 2024-02-09 ENCOUNTER — Ambulatory Visit: Admitting: Physician Assistant

## 2024-02-11 ENCOUNTER — Encounter: Admitting: Physician Assistant

## 2024-02-11 DIAGNOSIS — L02214 Cutaneous abscess of groin: Secondary | ICD-10-CM | POA: Diagnosis not present

## 2024-02-18 ENCOUNTER — Ambulatory Visit: Admitting: Physician Assistant

## 2024-02-18 DIAGNOSIS — L02214 Cutaneous abscess of groin: Secondary | ICD-10-CM | POA: Diagnosis not present

## 2024-02-25 ENCOUNTER — Encounter: Admitting: Internal Medicine

## 2024-02-25 DIAGNOSIS — L02214 Cutaneous abscess of groin: Secondary | ICD-10-CM | POA: Diagnosis not present

## 2024-03-10 ENCOUNTER — Ambulatory Visit: Admitting: Physician Assistant

## 2024-03-17 ENCOUNTER — Encounter: Attending: Physician Assistant | Admitting: Physician Assistant

## 2024-03-17 DIAGNOSIS — L02214 Cutaneous abscess of groin: Secondary | ICD-10-CM | POA: Insufficient documentation

## 2024-03-17 DIAGNOSIS — L98492 Non-pressure chronic ulcer of skin of other sites with fat layer exposed: Secondary | ICD-10-CM | POA: Diagnosis not present

## 2024-03-29 ENCOUNTER — Ambulatory Visit: Admitting: Physician Assistant

## 2024-03-31 ENCOUNTER — Encounter: Attending: Physician Assistant | Admitting: Physician Assistant

## 2024-03-31 DIAGNOSIS — L02214 Cutaneous abscess of groin: Secondary | ICD-10-CM | POA: Diagnosis present

## 2024-03-31 DIAGNOSIS — L98492 Non-pressure chronic ulcer of skin of other sites with fat layer exposed: Secondary | ICD-10-CM | POA: Diagnosis not present

## 2024-04-07 ENCOUNTER — Encounter: Admitting: Physician Assistant

## 2024-04-07 DIAGNOSIS — L02214 Cutaneous abscess of groin: Secondary | ICD-10-CM | POA: Diagnosis not present

## 2024-04-12 ENCOUNTER — Ambulatory Visit: Payer: Self-pay | Admitting: Family Medicine

## 2024-04-12 ENCOUNTER — Encounter: Payer: Self-pay | Admitting: Family Medicine

## 2024-04-12 VITALS — BP 118/80 | HR 75 | Temp 98.0°F | Ht 71.5 in | Wt 316.2 lb

## 2024-04-12 DIAGNOSIS — Z0000 Encounter for general adult medical examination without abnormal findings: Secondary | ICD-10-CM

## 2024-04-12 DIAGNOSIS — Z23 Encounter for immunization: Secondary | ICD-10-CM

## 2024-04-12 DIAGNOSIS — R030 Elevated blood-pressure reading, without diagnosis of hypertension: Secondary | ICD-10-CM

## 2024-04-12 DIAGNOSIS — M79671 Pain in right foot: Secondary | ICD-10-CM

## 2024-04-12 DIAGNOSIS — R351 Nocturia: Secondary | ICD-10-CM

## 2024-04-12 DIAGNOSIS — E782 Mixed hyperlipidemia: Secondary | ICD-10-CM | POA: Diagnosis not present

## 2024-04-12 DIAGNOSIS — N401 Enlarged prostate with lower urinary tract symptoms: Secondary | ICD-10-CM | POA: Diagnosis not present

## 2024-04-12 LAB — BAYER DCA HB A1C WAIVED: HB A1C (BAYER DCA - WAIVED): 5.8 % — ABNORMAL HIGH (ref 4.8–5.6)

## 2024-04-12 LAB — MICROALBUMIN, URINE WAIVED
Creatinine, Urine Waived: 200 mg/dL (ref 10–300)
Microalb, Ur Waived: 80 mg/L — ABNORMAL HIGH (ref 0–19)

## 2024-04-12 NOTE — Patient Instructions (Signed)
 Preventative Services:  Health Risk Assessment and Personalized Prevention Plan: Done today Bone Mass Measurements: N/a CVD Screening: done today Colon Cancer Screening: N/A Depression Screening: done today Diabetes Screening: done today Glaucoma Screening: see your eye doctor Hepatitis B vaccine: N/A Hepatitis C screening: up to date HIV Screening: up to date Flu Vaccine: given today Lung cancer Screening: N/A Obesity Screening: done today Pneumonia Vaccines (2): up to date STI Screening: N/A PSA screening: done today   Brandon Wheeler , Thank you for taking time to come for your Medicare Wellness Visit. I appreciate your ongoing commitment to your health goals. Please review the following plan we discussed and let me know if I can assist you in the future.   These are the goals we discussed:  Goals   None     This is a list of the screening recommended for you and due dates:  Health Maintenance  Topic Date Due   Zoster (Shingles) Vaccine (1 of 2) 10/17/1995   Flu Shot  01/29/2024   Medicare Annual Wellness Visit  04/08/2024   DTaP/Tdap/Td vaccine (2 - Td or Tdap) 04/12/2025*   Pneumococcal Vaccine for age over 66  Completed   Hepatitis C Screening  Completed   Meningitis B Vaccine  Aged Out   COVID-19 Vaccine  Discontinued  *Topic was postponed. The date shown is not the original due date.

## 2024-04-12 NOTE — Progress Notes (Signed)
 BP 118/80   Pulse 75   Temp 98 F (36.7 C) (Oral)   Ht 5' 11.5 (1.816 m)   Wt (!) 316 lb 3.2 oz (143.4 kg)   SpO2 95%   BMI 43.49 kg/m    Subjective:    Patient ID: Brandon Wheeler, male    DOB: [birth year], 78 y.o.   MRN: [id number]  HPI: Brandon Wheeler is a 78 y.o. male presenting on 04/12/2024 for comprehensive medical examination. Current medical complaints include:  HYPERLIPIDEMIA Hyperlipidemia status: excellent compliance Satisfied with current treatment?  yes Side effects:  no Medication compliance: excellent compliance Past cholesterol meds: simvastatin Supplements: none Aspirin :  no The ASCVD Risk score (Arnett DK, et al., 2019) failed to calculate for the following reasons:   Risk score cannot be calculated because patient has a medical history suggesting prior/existing ASCVD Chest pain:  no Coronary artery disease:  no  BPH BPH status: controlled Satisfied with current treatment?: yes Medication side effects: no Medication compliance: excellent compliance Duration: chronic Nocturia: 1-2x per night Urinary frequency:no Incomplete voiding: no Urgency: no Weak urinary stream: no Straining to start stream: no Dysuria: no Onset: gradual Severity: mild   He currently lives with: wife Interim Problems from his last visit: no  Functional Status Survey: Is the patient deaf or have difficulty hearing?: No Does the patient have difficulty seeing, even when wearing glasses/contacts?: No Does the patient have difficulty concentrating, remembering, or making decisions?: No Does the patient have difficulty walking or climbing stairs?: Yes Does the patient have difficulty dressing or bathing?: No Does the patient have difficulty doing errands alone such as visiting a doctor's office or shopping?: No  FALL RISK:    04/12/2024   10:26 AM 04/09/2023    9:21 AM 02/07/2021    2:58 PM  Fall Risk   Falls in the past year? 0 0 0  Number falls in past yr: 0 0 0   Injury with Fall? 0 0 0  Risk for fall due to : No Fall Risks No Fall Risks No Fall Risks  Follow up  Falls evaluation completed Falls evaluation completed      Data saved with a previous flowsheet row definition    Depression Screen    04/12/2024   10:26 AM 04/09/2023    9:21 AM 02/07/2021    2:58 PM  Depression screen PHQ 2/9  Decreased Interest 0 0 0  Down, Depressed, Hopeless 2 1 0  PHQ - 2 Score 2 1 0  Altered sleeping 0 0 0  Tired, decreased energy 3 2 0  Change in appetite 0 0 0  Feeling bad or failure about yourself  1 0 0  Trouble concentrating 0 0 0  Moving slowly or fidgety/restless 0 0 0  Suicidal thoughts 0 0 0  PHQ-9 Score 6 3 0  Difficult doing work/chores   Not difficult at all    Advanced Directives Does patient have a HCPOA?    no If yes, name and contact information:  Does patient have a living will or MOST form?  no  Past Medical History:  Past Medical History:  Diagnosis Date   BPH (benign prostatic hyperplasia)    Diverticulitis    Hyperlipidemia    Insomnia    PTSD (post-traumatic stress disorder)    Sleep apnea     Surgical History:  Past Surgical History:  Procedure Laterality Date   BLEPHAROPLASTY     CATARACT EXTRACTION W/PHACO Right 01/30/2020   Procedure: CATARACT  EXTRACTION PHACO AND INTRAOCULAR LENS PLACEMENT (IOC) RIGHT;  Surgeon: Myrna Adine Anes, MD;  Location: [facility];  Service: Ophthalmology;  Laterality: Right;  2.20 0:27.0   CATARACT EXTRACTION W/PHACO Left 03/26/2020   Procedure: CATARACT EXTRACTION PHACO AND INTRAOCULAR LENS PLACEMENT (IOC) LEFT 2.47  00:31.8;  Surgeon: Myrna Adine Anes, MD;  Location: [facility];  Service: Ophthalmology;  Laterality: Left;   DENTAL SURGERY     Implants   FASCIOTOMY     HERNIA REPAIR     skin grafts      Medications:  Current Outpatient Medications on File Prior to Visit  Medication Sig   acetaminophen  (TYLENOL ) 325 MG tablet Take 975 mg by mouth.   capsicum  (ZOSTRIX) 0.075 % topical cream Apply topically.   Carboxymethylcellulose Sod PF 0.5 % SOLN Apply to eye.   cetirizine (ZYRTEC) 10 MG tablet Take 10 mg by mouth daily.   diclofenac Sodium (VOLTAREN) 1 % GEL Apply topically.   Docusate Sodium (DSS) 100 MG CAPS Take 100 mg by mouth.   erythromycin ophthalmic ointment Apply to eye.   finasteride  (PROSCAR ) 5 MG tablet Take 1 tablet by mouth daily.   fluticasone (FLONASE) 50 MCG/ACT nasal spray Place into the nose.   neomycin-polymyxin b-dexamethasone  (MAXITROL) 3.5-10000-0.1 SUSP 1 drop 2 (two) times daily.   Polyethylene Glycol 3350 POWD Take by mouth.   simvastatin (ZOCOR) 80 MG tablet Take 40 mg by mouth.   tamsulosin  (FLOMAX ) 0.4 MG CAPS capsule Take 0.4 mg by mouth at bedtime.   valACYclovir (VALTREX) 500 MG tablet Take 500 mg by mouth daily.   VALTREX 1 g tablet Take 1,000 mg by mouth 3 (three) times daily.   zolpidem  (AMBIEN ) 10 MG tablet Take 10 mg by mouth at bedtime.    clotrimazole (LOTRIMIN) 1 % cream Apply 1 Application topically 2 (two) times daily. (Patient not taking: Reported on 04/12/2024)   No current facility-administered medications on file prior to visit.    Allergies:  No Known Allergies  Social History:  Social History   Socioeconomic History   Marital status: Married    Spouse name: Not on file   Number of children: Not on file   Years of education: Not on file   Highest education level: Not on file  Occupational History   Not on file  Tobacco Use   Smoking status: Former    Current packs/day: 0.00    Types: Cigarettes    Quit date: 1980    Years since quitting: 45.8   Smokeless tobacco: Never  Vaping Use   Vaping status: Never Used  Substance and Sexual Activity   Alcohol use: Yes    Comment: rare   Drug use: No   Sexual activity: Not on file  Other Topics Concern   Not on file  Social History Narrative   Not on file   Social Drivers of Health   Financial Resource Strain: Not on file  Food  Insecurity: No Food Insecurity (04/12/2024)   Hunger Vital Sign    Worried About Running Out of Food in the Last Year: Never true    Ran Out of Food in the Last Year: Never true  Transportation Needs: No Transportation Needs (04/12/2024)   PRAPARE - Administrator, Civil Service (Medical): No    Lack of Transportation (Non-Medical): No  Physical Activity: Not on file  Stress: Not on file  Social Connections: Not on file  Intimate Partner Violence: Not on file   Social  History   Tobacco Use  Smoking Status Former   Current packs/day: 0.00   Types: Cigarettes   Quit date: 1980   Years since quitting: 45.8  Smokeless Tobacco Never   Social History   Substance and Sexual Activity  Alcohol Use Yes   Comment: rare    Family History:  Family History  Problem Relation Age of Onset   Ovarian cancer Mother    Hypertension Mother    CAD Father     Past medical history, surgical history, medications, allergies, family history and social history reviewed with patient today and changes made to appropriate areas of the chart.   Review of Systems  Constitutional: Negative.   HENT: Negative.    Eyes: Negative.   Respiratory: Negative.    Cardiovascular: Negative.   Gastrointestinal:  Positive for abdominal pain (very rarely). Negative for blood in stool, constipation, diarrhea, heartburn, melena, nausea and vomiting.  Genitourinary: Negative.   Musculoskeletal:  Positive for joint pain and myalgias. Negative for back pain, falls and neck pain.  Skin: Negative.   Neurological:  Positive for tingling. Negative for dizziness, tremors, sensory change, speech change, focal weakness, seizures, loss of consciousness, weakness and headaches.  Endo/Heme/Allergies:  Positive for polydipsia. Negative for environmental allergies. Bruises/bleeds easily.  Psychiatric/Behavioral:  Positive for depression. Negative for hallucinations, memory loss, substance abuse and suicidal ideas. The  patient is nervous/anxious. The patient does not have insomnia.    All other ROS negative except what is listed above and in the HPI.      Objective:    BP 118/80   Pulse 75   Temp 98 F (36.7 C) (Oral)   Ht 5' 11.5 (1.816 m)   Wt (!) 316 lb 3.2 oz (143.4 kg)   SpO2 95%   BMI 43.49 kg/m   Wt Readings from Last 3 Encounters:  04/12/24 (!) 316 lb 3.2 oz (143.4 kg)  04/09/23 (!) 308 lb 3.2 oz (139.8 kg)  02/07/21 299 lb 6.4 oz (135.8 kg)     Physical Exam Vitals and nursing note reviewed.  Constitutional:      General: He is not in acute distress.    Appearance: Normal appearance. He is obese. He is not ill-appearing, toxic-appearing or diaphoretic.  HENT:     Head: Normocephalic and atraumatic.     Right Ear: Tympanic membrane, ear canal and external ear normal. There is no impacted cerumen.     Left Ear: Tympanic membrane, ear canal and external ear normal. There is no impacted cerumen.     Nose: Nose normal. No congestion or rhinorrhea.     Mouth/Throat:     Mouth: Mucous membranes are moist.     Pharynx: Oropharynx is clear. No oropharyngeal exudate or posterior oropharyngeal erythema.  Eyes:     General: No scleral icterus.       Right eye: No discharge.        Left eye: No discharge.     Extraocular Movements: Extraocular movements intact.     Conjunctiva/sclera: Conjunctivae normal.     Pupils: Pupils are equal, round, and reactive to light.  Neck:     Vascular: No carotid bruit.  Cardiovascular:     Rate and Rhythm: Normal rate and regular rhythm.     Pulses: Normal pulses.     Heart sounds: No murmur heard.    No friction rub. No gallop.  Pulmonary:     Effort: Pulmonary effort is normal. No respiratory distress.     Breath sounds:  Normal breath sounds. No stridor. No wheezing, rhonchi or rales.  Chest:     Chest wall: No tenderness.  Abdominal:     General: Abdomen is flat. Bowel sounds are normal. There is no distension.     Palpations: Abdomen is  soft. There is no mass.     Tenderness: There is no abdominal tenderness. There is no right CVA tenderness, left CVA tenderness, guarding or rebound.     Hernia: No hernia is present.  Genitourinary:    Comments: Genital exam deferred with shared decision making Musculoskeletal:        General: No swelling, tenderness, deformity or signs of injury.     Cervical back: Normal range of motion and neck supple. No rigidity. No muscular tenderness.     Right lower leg: No edema.     Left lower leg: No edema.  Lymphadenopathy:     Cervical: No cervical adenopathy.  Skin:    General: Skin is warm and dry.     Capillary Refill: Capillary refill takes less than 2 seconds.     Coloration: Skin is not jaundiced or pale.     Findings: No bruising, erythema, lesion or rash.  Neurological:     General: No focal deficit present.     Mental Status: He is alert and oriented to person, place, and time.     Cranial Nerves: No cranial nerve deficit.     Sensory: No sensory deficit.     Motor: No weakness.     Coordination: Coordination normal.     Gait: Gait normal.     Deep Tendon Reflexes: Reflexes normal.  Psychiatric:        Mood and Affect: Mood normal.        Behavior: Behavior normal.        Thought Content: Thought content normal.        Judgment: Judgment normal.        04/12/2024   10:36 AM 04/09/2023    9:44 AM  6CIT Screen  What Year? 0 points 0 points  What month? 0 points 0 points  What time? 0 points 0 points  Count back from 20 0 points 0 points  Months in reverse 4 points 4 points  Repeat phrase 2 points 6 points  Total Score 6 points 10 points    Results for orders placed or performed in visit on 04/12/24  Comprehensive metabolic panel with GFR   Collection Time: 04/12/24 10:49 AM  Result Value Ref Range   Glucose 119 (H) 70 - 99 mg/dL   BUN 16 8 - 27 mg/dL   Creatinine, Ser 8.69 (H) 0.76 - 1.27 mg/dL   eGFR 56 (L) >40 fO/fpw/8.26   BUN/Creatinine Ratio 12 10 - 24    Sodium 141 134 - 144 mmol/L   Potassium 4.3 3.5 - 5.2 mmol/L   Chloride 105 96 - 106 mmol/L   CO2 22 20 - 29 mmol/L   Calcium 9.1 8.6 - 10.2 mg/dL   Total Protein 6.9 6.0 - 8.5 g/dL   Albumin 3.7 (L) 3.8 - 4.8 g/dL   Globulin, Total 3.2 1.5 - 4.5 g/dL   Bilirubin Total 0.6 0.0 - 1.2 mg/dL   Alkaline Phosphatase 83 47 - 123 IU/L   AST 19 0 - 40 IU/L   ALT 14 0 - 44 IU/L  CBC with Differential/Platelet   Collection Time: 04/12/24 10:49 AM  Result Value Ref Range   WBC 9.0 3.4 - 10.8 x10E3/uL   RBC  4.17 4.14 - 5.80 x10E6/uL   Hemoglobin 13.8 13.0 - 17.7 g/dL   Hematocrit 58.2 62.4 - 51.0 %   MCV 100 (H) 79 - 97 fL   MCH 33.1 (H) 26.6 - 33.0 pg   MCHC 33.1 31.5 - 35.7 g/dL   RDW 87.7 88.3 - 84.5 %   Platelets 237 150 - 450 x10E3/uL   Neutrophils 71 Not Estab. %   Lymphs 18 Not Estab. %   Monocytes 8 Not Estab. %   Eos 2 Not Estab. %   Basos 1 Not Estab. %   Neutrophils Absolute 6.4 1.4 - 7.0 x10E3/uL   Lymphocytes Absolute 1.6 0.7 - 3.1 x10E3/uL   Monocytes Absolute 0.7 0.1 - 0.9 x10E3/uL   EOS (ABSOLUTE) 0.2 0.0 - 0.4 x10E3/uL   Basophils Absolute 0.1 0.0 - 0.2 x10E3/uL   Immature Granulocytes 0 Not Estab. %   Immature Grans (Abs) 0.0 0.0 - 0.1 x10E3/uL  Lipid Panel w/o Chol/HDL Ratio   Collection Time: 04/12/24 10:49 AM  Result Value Ref Range   Cholesterol, Total 109 100 - 199 mg/dL   Triglycerides 80 0 - 149 mg/dL   HDL 46 >60 mg/dL   VLDL Cholesterol Cal 16 5 - 40 mg/dL   LDL Chol Calc (NIH) 47 0 - 99 mg/dL  PSA   Collection Time: 04/12/24 10:49 AM  Result Value Ref Range   Prostate Specific Ag, Serum 0.1 0.0 - 4.0 ng/mL  TSH   Collection Time: 04/12/24 10:49 AM  Result Value Ref Range   TSH 3.330 0.450 - 4.500 uIU/mL  Microalbumin, Urine Waived   Collection Time: 04/12/24 10:49 AM  Result Value Ref Range   Microalb, Ur Waived 80 (H) 0 - 19 mg/L   Creatinine, Urine Waived 200 10 - 300 mg/dL   Microalb/Creat Ratio 30-300 (H) <30 mg/g  Bayer DCA Hb A1c  Waived   Collection Time: 04/12/24 10:49 AM  Result Value Ref Range   HB A1C (BAYER DCA - WAIVED) 5.8 (H) 4.8 - 5.6 %      Assessment & Plan:   Problem List Items Addressed This Visit       Genitourinary   Benign prostatic hyperplasia   Under good control on current regimen. Continue current regimen. Continue to monitor. Call with any concerns. Refills given. Labs drawn today.       Relevant Medications   tamsulosin  (FLOMAX ) 0.4 MG CAPS capsule   Other Relevant Orders   Comprehensive metabolic panel with GFR (Completed)   CBC with Differential/Platelet (Completed)   PSA (Completed)     Other   Mixed hyperlipidemia   Under good control on current regimen. Continue current regimen. Continue to monitor. Call with any concerns. Refills given. Labs drawn today.        Relevant Medications   simvastatin (ZOCOR) 80 MG tablet   Other Relevant Orders   Comprehensive metabolic panel with GFR (Completed)   CBC with Differential/Platelet (Completed)   Lipid Panel w/o Chol/HDL Ratio (Completed)   TSH (Completed)   Morbid obesity (HCC)   Encouraged diet and exercise with goal of losing 1-2lbs per week. Call with any concerns.       Relevant Orders   Bayer DCA Hb A1c Waived (Completed)   Other Visit Diagnoses       Encounter for Medicare annual wellness exam    -  Primary   Preventtive care discussed today as below.     Routine general medical examination at a health care facility  Vaccines updated/declined. Screening labs checked today. Continue diet and exercise. Call with any concerns.     Right foot pain       Referral to podiatry made today.   Relevant Orders   Ambulatory referral to Podiatry     Elevated blood pressure reading       Better on recheck. Encouraged diet and exercise. Call with any concerns.   Relevant Orders   Microalbumin, Urine Waived (Completed)     Needs flu shot       Flu shot given today.   Relevant Orders   Flu vaccine HIGH DOSE PF(Fluzone  Trivalent) (Completed)        Preventative Services:  Health Risk Assessment and Personalized Prevention Plan: Done today Bone Mass Measurements: N/a CVD Screening: done today Colon Cancer Screening: N/A Depression Screening: done today Diabetes Screening: done today Glaucoma Screening: see your eye doctor Hepatitis B vaccine: N/A Hepatitis C screening: up to date HIV Screening: up to date Flu Vaccine: given today Lung cancer Screening: N/A Obesity Screening: done today Pneumonia Vaccines (2): up to date STI Screening: N/A PSA screening: done today   LABORATORY TESTING:  Health maintenance labs ordered today as discussed above.   The natural history of prostate cancer and ongoing controversy regarding screening and potential treatment outcomes of prostate cancer has been discussed with the patient. The meaning of a false positive PSA and a false negative PSA has been discussed. He indicates understanding of the limitations of this screening test and wishes to proceed with screening PSA testing.   IMMUNIZATIONS:   - Tdap: Tetanus vaccination status reviewed: Declined. - Influenza: Administered today - Pneumovax: Up to date - Prevnar: Up to date - Zostavax vaccine: Given elsewhere  SCREENING: - Colonoscopy: Not applicable  Discussed with patient purpose of the colonoscopy is to detect colon cancer at curable precancerous or early stages   PATIENT COUNSELING:    Sexuality: Discussed sexually transmitted diseases, partner selection, use of condoms, avoidance of unintended pregnancy  and contraceptive alternatives.   Advised to avoid cigarette smoking.  I discussed with the patient that most people either abstain from alcohol or drink within safe limits (<=14/week and <=4 drinks/occasion for males, <=7/weeks and <= 3 drinks/occasion for females) and that the risk for alcohol disorders and other health effects rises proportionally with the number of drinks per week and how  often a drinker exceeds daily limits.  Discussed cessation/primary prevention of drug use and availability of treatment for abuse.   Diet: Encouraged to adjust caloric intake to maintain  or achieve ideal body weight, to reduce intake of dietary saturated fat and total fat, to limit sodium intake by avoiding high sodium foods and not adding table salt, and to maintain adequate dietary potassium and calcium preferably from fresh fruits, vegetables, and low-fat dairy products.    stressed the importance of regular exercise  Injury prevention: Discussed safety belts, safety helmets, smoke detector, smoking near bedding or upholstery.   Dental health: Discussed importance of regular tooth brushing, flossing, and dental visits.   Follow up plan: NEXT PREVENTATIVE PHYSICAL DUE IN 1 YEAR. Return in about 1 year (around 04/12/2025) for physical.

## 2024-04-13 LAB — CBC WITH DIFFERENTIAL/PLATELET
Basophils Absolute: 0.1 x10E3/uL (ref 0.0–0.2)
Basos: 1 %
EOS (ABSOLUTE): 0.2 x10E3/uL (ref 0.0–0.4)
Eos: 2 %
Hematocrit: 41.7 % (ref 37.5–51.0)
Hemoglobin: 13.8 g/dL (ref 13.0–17.7)
Immature Grans (Abs): 0 x10E3/uL (ref 0.0–0.1)
Immature Granulocytes: 0 %
Lymphocytes Absolute: 1.6 x10E3/uL (ref 0.7–3.1)
Lymphs: 18 %
MCH: 33.1 pg — ABNORMAL HIGH (ref 26.6–33.0)
MCHC: 33.1 g/dL (ref 31.5–35.7)
MCV: 100 fL — ABNORMAL HIGH (ref 79–97)
Monocytes Absolute: 0.7 x10E3/uL (ref 0.1–0.9)
Monocytes: 8 %
Neutrophils Absolute: 6.4 x10E3/uL (ref 1.4–7.0)
Neutrophils: 71 %
Platelets: 237 x10E3/uL (ref 150–450)
RBC: 4.17 x10E6/uL (ref 4.14–5.80)
RDW: 12.2 % (ref 11.6–15.4)
WBC: 9 x10E3/uL (ref 3.4–10.8)

## 2024-04-13 LAB — LIPID PANEL W/O CHOL/HDL RATIO
Cholesterol, Total: 109 mg/dL (ref 100–199)
HDL: 46 mg/dL (ref >39)
LDL Chol Calc (NIH): 47 mg/dL (ref 0–99)
Triglycerides: 80 mg/dL (ref 0–149)
VLDL Cholesterol Cal: 16 mg/dL (ref 5–40)

## 2024-04-13 LAB — PSA: Prostate Specific Ag, Serum: 0.1 ng/mL (ref 0.0–4.0)

## 2024-04-13 LAB — COMPREHENSIVE METABOLIC PANEL WITH GFR
ALT: 14 IU/L (ref 0–44)
AST: 19 IU/L (ref 0–40)
Albumin: 3.7 g/dL — ABNORMAL LOW (ref 3.8–4.8)
Alkaline Phosphatase: 83 IU/L (ref 47–123)
BUN/Creatinine Ratio: 12 (ref 10–24)
BUN: 16 mg/dL (ref 8–27)
Bilirubin Total: 0.6 mg/dL (ref 0.0–1.2)
CO2: 22 mmol/L (ref 20–29)
Calcium: 9.1 mg/dL (ref 8.6–10.2)
Chloride: 105 mmol/L (ref 96–106)
Creatinine, Ser: 1.3 mg/dL — ABNORMAL HIGH (ref 0.76–1.27)
Globulin, Total: 3.2 g/dL (ref 1.5–4.5)
Glucose: 119 mg/dL — ABNORMAL HIGH (ref 70–99)
Potassium: 4.3 mmol/L (ref 3.5–5.2)
Sodium: 141 mmol/L (ref 134–144)
Total Protein: 6.9 g/dL (ref 6.0–8.5)
eGFR: 56 mL/min/1.73 — ABNORMAL LOW (ref >59)

## 2024-04-13 LAB — TSH: TSH: 3.33 u[IU]/mL (ref 0.450–4.500)

## 2024-04-15 ENCOUNTER — Ambulatory Visit: Payer: Self-pay | Admitting: Family Medicine

## 2024-04-15 DIAGNOSIS — N289 Disorder of kidney and ureter, unspecified: Secondary | ICD-10-CM

## 2024-04-15 NOTE — Progress Notes (Signed)
 Called patient lvm for patient to call back and schedule 3 week lab appt.

## 2024-04-17 ENCOUNTER — Encounter: Payer: Self-pay | Admitting: Family Medicine

## 2024-04-17 NOTE — Assessment & Plan Note (Signed)
 Under good control on current regimen. Continue current regimen. Continue to monitor. Call with any concerns. Refills given. Labs drawn today.

## 2024-04-17 NOTE — Progress Notes (Signed)
 Subjective:   Brandon Wheeler is a 78 y.o. male who presents for Medicare Annual/Subsequent preventive examination.  Visit Complete: In person  Patient Medicare AWV questionnaire was completed by the patient on 04/12/24; I have confirmed that all information answered by patient is correct and no changes since this date.        Objective:    Today's Vitals   04/12/24 1026 04/12/24 1041  BP: (!) 140/83 118/80  Pulse: 75   Temp: 98 F (36.7 C)   TempSrc: Oral   SpO2: 95%   Weight: (!) 316 lb 3.2 oz (143.4 kg)   Height: 5' 11.5 (1.816 m)   PainSc: 0-No pain    Body mass index is 43.49 kg/m.     08/26/2023   11:36 AM 03/26/2020   11:14 AM 01/30/2020   10:55 AM 05/23/2017    2:58 PM 05/23/2017    9:34 AM  Advanced Directives  Does Patient Have a Medical Advance Directive? No No No No  No   Does patient want to make changes to medical advance directive?   No - Patient declined    Copy of Healthcare Power of Attorney in Chart?   No - copy requested    Would patient like information on creating a medical advance directive?  No - Patient declined  No - Patient declined  No - Patient declined      Data saved with a previous flowsheet row definition    Current Medications (verified) Outpatient Encounter Medications as of 04/12/2024  Medication Sig   acetaminophen  (TYLENOL ) 325 MG tablet Take 975 mg by mouth.   capsicum (ZOSTRIX) 0.075 % topical cream Apply topically.   Carboxymethylcellulose Sod PF 0.5 % SOLN Apply to eye.   cetirizine (ZYRTEC) 10 MG tablet Take 10 mg by mouth daily.   diclofenac Sodium (VOLTAREN) 1 % GEL Apply topically.   Docusate Sodium (DSS) 100 MG CAPS Take 100 mg by mouth.   erythromycin ophthalmic ointment Apply to eye.   finasteride  (PROSCAR ) 5 MG tablet Take 1 tablet by mouth daily.   fluticasone (FLONASE) 50 MCG/ACT nasal spray Place into the nose.   neomycin-polymyxin b-dexamethasone  (MAXITROL) 3.5-10000-0.1 SUSP 1 drop 2 (two) times daily.    Polyethylene Glycol 3350 POWD Take by mouth.   simvastatin (ZOCOR) 80 MG tablet Take 40 mg by mouth.   tamsulosin  (FLOMAX ) 0.4 MG CAPS capsule Take 0.4 mg by mouth at bedtime.   valACYclovir (VALTREX) 500 MG tablet Take 500 mg by mouth daily.   VALTREX 1 g tablet Take 1,000 mg by mouth 3 (three) times daily.   zolpidem  (AMBIEN ) 10 MG tablet Take 10 mg by mouth at bedtime.    [DISCONTINUED] capsicum (ZOSTRIX) 0.075 % topical cream Apply 1 Application topically 4 (four) times daily.   [DISCONTINUED] fluticasone (FLONASE) 50 MCG/ACT nasal spray Place into both nostrils daily.   [DISCONTINUED] simvastatin (ZOCOR) 80 MG tablet TAKE ONE-HALF TABLET BY MOUTH AT BEDTIME FOR CHOLESTEROL   [DISCONTINUED] tamsulosin  (FLOMAX ) 0.4 MG CAPS capsule Take 0.4 mg by mouth.   clotrimazole (LOTRIMIN) 1 % cream Apply 1 Application topically 2 (two) times daily. (Patient not taking: Reported on 04/12/2024)   [DISCONTINUED] carboxymethylcellulose (REFRESH PLUS) 0.5 % SOLN INSTILL 1 DROP IN EACH EYE EVERY 4 TO 6 HOURS AS NEEDED   [DISCONTINUED] cetirizine (ZYRTEC) 10 MG tablet Take 1 tablet by mouth at bedtime.   [DISCONTINUED] Docusate Sodium (DSS) 100 MG CAPS TAKE ONE CAPSULE BY MOUTH TWO TIMES A DAY TO PREVENT  CONSTIPATION   [DISCONTINUED] doxycycline  (VIBRAMYCIN ) 100 MG capsule Take 1 capsule (100 mg total) by mouth 2 (two) times daily.   [DISCONTINUED] miconazole (MICOTIN) 2 % powder APPLY MODERATE AMOUNT TOPICALLY TWO TIMES A DAY FOR FUNGAL INFECTION   [DISCONTINUED] montelukast  (SINGULAIR ) 10 MG tablet Take 1 tablet by mouth daily.   [DISCONTINUED] mupirocin  ointment (BACTROBAN ) 2 % Apply 1 Application topically 2 (two) times daily. (Patient not taking: Reported on 04/12/2024)   No facility-administered encounter medications on file as of 04/12/2024.    Allergies (verified) Patient has no known allergies.   History: Past Medical History:  Diagnosis Date   BPH (benign prostatic hyperplasia)     Diverticulitis    Hyperlipidemia    Insomnia    PTSD (post-traumatic stress disorder)    Sleep apnea    Past Surgical History:  Procedure Laterality Date   BLEPHAROPLASTY     CATARACT EXTRACTION W/PHACO Right 01/30/2020   Procedure: CATARACT EXTRACTION PHACO AND INTRAOCULAR LENS PLACEMENT (IOC) RIGHT;  Surgeon: Myrna Adine Anes, MD;  Location: [facility];  Service: Ophthalmology;  Laterality: Right;  2.20 0:27.0   CATARACT EXTRACTION W/PHACO Left 03/26/2020   Procedure: CATARACT EXTRACTION PHACO AND INTRAOCULAR LENS PLACEMENT (IOC) LEFT 2.47  00:31.8;  Surgeon: Myrna Adine Anes, MD;  Location: [facility];  Service: Ophthalmology;  Laterality: Left;   DENTAL SURGERY     Implants   FASCIOTOMY     HERNIA REPAIR     skin grafts     Family History  Problem Relation Age of Onset   Ovarian cancer Mother    Hypertension Mother    CAD Father    Social History   Socioeconomic History   Marital status: Married    Spouse name: Not on file   Number of children: Not on file   Years of education: Not on file   Highest education level: Not on file  Occupational History   Not on file  Tobacco Use   Smoking status: Former    Current packs/day: 0.00    Types: Cigarettes    Quit date: 1980    Years since quitting: 45.8   Smokeless tobacco: Never  Vaping Use   Vaping status: Never Used  Substance and Sexual Activity   Alcohol use: Yes    Comment: rare   Drug use: No   Sexual activity: Not on file  Other Topics Concern   Not on file  Social History Narrative   Not on file   Social Drivers of Health   Financial Resource Strain: Not on file  Food Insecurity: No Food Insecurity (04/12/2024)   Hunger Vital Sign    Worried About Running Out of Food in the Last Year: Never true    Ran Out of Food in the Last Year: Never true  Transportation Needs: No Transportation Needs (04/12/2024)   PRAPARE - Administrator, Civil Service (Medical): No    Lack of  Transportation (Non-Medical): No  Physical Activity: Not on file  Stress: Not on file  Social Connections: Not on file    Tobacco Counseling Counseling given: Not Answered   Clinical Intake:     Pain Score: 0-No pain                  Activities of Daily Living    04/12/2024   10:35 AM  In your present state of health, do you have any difficulty performing the following activities:  Hearing? 0  Vision? 0  Difficulty  concentrating or making decisions? 0  Walking or climbing stairs? 1  Dressing or bathing? 0  Doing errands, shopping? 0    Patient Care Team: Vicci Duwaine SQUIBB, DO as PCP - General (Family Medicine) Phyllistine Lenis, MD as Referring Physician (Internal Medicine)  Indicate any recent Medical Services you may have received from other than [facility] providers in the past year (date may be approximate).     Assessment:   This is a routine wellness examination for [place].  Hearing/Vision screen No results found.   Goals Addressed   None    Depression Screen    04/12/2024   10:26 AM 04/09/2023    9:21 AM 02/07/2021    2:58 PM  PHQ 2/9 Scores  PHQ - 2 Score 2 1 0  PHQ- 9 Score 6 3 0    Fall Risk    04/12/2024   10:26 AM 04/09/2023    9:21 AM 02/07/2021    2:58 PM  Fall Risk   Falls in the past year? 0 0 0  Number falls in past yr: 0 0 0  Injury with Fall? 0 0 0  Risk for fall due to : No Fall Risks No Fall Risks No Fall Risks  Follow up  Falls evaluation completed Falls evaluation completed      Data saved with a previous flowsheet row definition    MEDICARE RISK AT HOME:    TIMED UP AND GO:  Was the test performed?  Yes  Length of time to ambulate 10 feet: 12 sec Gait slow and steady without use of assistive device    Cognitive Function:        04/12/2024   10:36 AM 04/09/2023    9:44 AM  6CIT Screen  What Year? 0 points 0 points  What month? 0 points 0 points  What time? 0 points 0 points  Count back from 20 0 points 0  points  Months in reverse 4 points 4 points  Repeat phrase 2 points 6 points  Total Score 6 points 10 points    Immunizations Immunization History  Administered Date(s) Administered   Fluad Quad(high Dose 65+) 05/17/2020, 04/23/2021   Fluad Trivalent(High Dose 65+) 05/07/2018, 03/03/2023   INFLUENZA, HIGH DOSE SEASONAL PF 04/02/2016, 04/05/2019, 04/12/2024   Influenza, Seasonal, Injecte, Preservative Fre 05/16/2010, 04/20/2012, 04/20/2013   Influenza-Unspecified 05/30/2004, 04/30/2005, 07/31/2006, 04/06/2007, 04/03/2008, 08/28/2009, 05/16/2010, 03/01/2011, 05/30/2014, 05/01/2015, 04/20/2017, 05/07/2018, 04/05/2019   PFIZER(Purple Top)SARS-COV-2 Vaccination 08/09/2019, 08/31/2019, 05/03/2020, 05/31/2020   Pneumococcal Conjugate-13 07/05/2014   Pneumococcal Polysaccharide-23 11/05/2011   Tdap 11/05/2011   Zoster, Live 04/02/2016    TDAP status: Due, Education has been provided regarding the importance of this vaccine. Advised may receive this vaccine at local pharmacy or Health Dept. Aware to provide a copy of the vaccination record if obtained from local pharmacy or Health Dept. Verbalized acceptance and understanding.  Flu Vaccine status: Completed at today's visit  Pneumococcal vaccine status: Up to date  Covid-19 vaccine status: Declined, Education has been provided regarding the importance of this vaccine but patient still declined. Advised may receive this vaccine at local pharmacy or Health Dept.or vaccine clinic. Aware to provide a copy of the vaccination record if obtained from local pharmacy or Health Dept. Verbalized acceptance and understanding.  Qualifies for Shingles Vaccine? Yes   Zostavax completed Yes   Shingrix Completed?: No.    Education has been provided regarding the importance of this vaccine. Patient has been advised to call insurance company to determine out of pocket  expense if they have not yet received this vaccine. Advised may also receive vaccine at local  pharmacy or Health Dept. Verbalized acceptance and understanding.  Screening Tests Health Maintenance  Topic Date Due   Zoster Vaccines- Shingrix (1 of 2) 10/17/1995   Medicare Annual Wellness (AWV)  04/08/2024   DTaP/Tdap/Td (2 - Td or Tdap) 04/12/2025 (Originally 11/04/2021)   Pneumococcal Vaccine: 50+ Years  Completed   Influenza Vaccine  Completed   Hepatitis C Screening  Completed   Meningococcal B Vaccine  Aged Out   COVID-19 Vaccine  Discontinued    Health Maintenance  Health Maintenance Due  Topic Date Due   Zoster Vaccines- Shingrix (1 of 2) 10/17/1995   Medicare Annual Wellness (AWV)  04/08/2024    Colorectal cancer screening: No longer required.   Lung Cancer Screening: (Low Dose CT Chest recommended if Age 36-80 years, 20 pack-year currently smoking OR have quit w/in 15years.) does not qualify.   Additional Screening:  Hepatitis C Screening: does qualify; Completed 04/09/23  Vision Screening: Recommended annual ophthalmology exams for early detection of glaucoma and other disorders of the eye.  Dental Screening: Recommended annual dental exams for proper oral hygiene  Community Resource Referral / Chronic Care Management: CRR required this visit?  No   CCM required this visit?  No     Plan:     I have personally reviewed and noted the following in the patient's chart:   Medical and social history Use of alcohol, tobacco or illicit drugs  Current medications and supplements including opioid prescriptions. Patient is not currently taking opioid prescriptions. Functional ability and status Nutritional status Physical activity Advanced directives List of other physicians Hospitalizations, surgeries, and ER visits in previous 12 months Vitals Screenings to include cognitive, depression, and falls Referrals and appointments  In addition, I have reviewed and discussed with patient certain preventive protocols, quality metrics, and best practice  recommendations. A written personalized care plan for preventive services as well as general preventive health recommendations were provided to patient.     Duwaine Louder, DO   04/17/2024   After Visit Summary: (In Person-Printed) AVS printed and given to the patient

## 2024-04-17 NOTE — Assessment & Plan Note (Signed)
 Encouraged diet and exercise with goal of losing 1-2lbs per week. Call with any concerns.

## 2024-04-21 ENCOUNTER — Ambulatory Visit: Admitting: Podiatry

## 2024-04-21 ENCOUNTER — Encounter: Admitting: Physician Assistant

## 2024-04-21 DIAGNOSIS — M19071 Primary osteoarthritis, right ankle and foot: Secondary | ICD-10-CM | POA: Diagnosis not present

## 2024-04-21 DIAGNOSIS — L02214 Cutaneous abscess of groin: Secondary | ICD-10-CM | POA: Diagnosis not present

## 2024-04-21 NOTE — Progress Notes (Signed)
 Subjective:  Patient ID: Brandon Wheeler, male    DOB: 1945-09-03,  MRN: [id number]  Chief Complaint  Patient presents with   Foot Pain    78 y.o. male presents with the above complaint.  Patient presents with right midfoot pain that has been on for quite some time is progressive gotten worse worse with ambulation worse with pressure pain scale 7 out of 10 dull achy doing she would like to discuss treatment options for it he states that he has some history of arthritis to the foot and it seems to be acting up   Review of Systems: Negative except as noted in the HPI. Denies N/V/F/Ch.  Past Medical History:  Diagnosis Date   BPH (benign prostatic hyperplasia)    Diverticulitis    Hyperlipidemia    Insomnia    PTSD (post-traumatic stress disorder)    Sleep apnea     Current Outpatient Medications:    acetaminophen  (TYLENOL ) 325 MG tablet, Take 975 mg by mouth., Disp: , Rfl:    capsicum (ZOSTRIX) 0.075 % topical cream, Apply topically., Disp: , Rfl:    Carboxymethylcellulose Sod PF 0.5 % SOLN, Apply to eye., Disp: , Rfl:    cetirizine (ZYRTEC) 10 MG tablet, Take 10 mg by mouth daily., Disp: , Rfl:    clotrimazole (LOTRIMIN) 1 % cream, Apply 1 Application topically 2 (two) times daily. (Patient not taking: Reported on 04/12/2024), Disp: , Rfl:    diclofenac Sodium (VOLTAREN) 1 % GEL, Apply topically., Disp: , Rfl:    Docusate Sodium (DSS) 100 MG CAPS, Take 100 mg by mouth., Disp: , Rfl:    erythromycin ophthalmic ointment, Apply to eye., Disp: , Rfl:    finasteride  (PROSCAR ) 5 MG tablet, Take 1 tablet by mouth daily., Disp: , Rfl:    fluticasone (FLONASE) 50 MCG/ACT nasal spray, Place into the nose., Disp: , Rfl:    neomycin-polymyxin b-dexamethasone  (MAXITROL) 3.5-10000-0.1 SUSP, 1 drop 2 (two) times daily., Disp: , Rfl:    Polyethylene Glycol 3350 POWD, Take by mouth., Disp: , Rfl:    simvastatin (ZOCOR) 80 MG tablet, Take 40 mg by mouth., Disp: , Rfl:    tamsulosin  (FLOMAX ) 0.4 MG  CAPS capsule, Take 0.4 mg by mouth at bedtime., Disp: , Rfl:    valACYclovir (VALTREX) 500 MG tablet, Take 500 mg by mouth daily., Disp: , Rfl:    VALTREX 1 g tablet, Take 1,000 mg by mouth 3 (three) times daily., Disp: , Rfl:    zolpidem  (AMBIEN ) 10 MG tablet, Take 10 mg by mouth at bedtime. , Disp: , Rfl:   Social History   Tobacco Use  Smoking Status Former   Current packs/day: 0.00   Types: Cigarettes   Quit date: 1980   Years since quitting: 45.8  Smokeless Tobacco Never    No Known Allergies Objective:  There were no vitals filed for this visit. There is no height or weight on file to calculate BMI. Constitutional Well developed. Well nourished.  Vascular Dorsalis pedis pulses palpable bilaterally. Posterior tibial pulses palpable bilaterally. Capillary refill normal to all digits.  No cyanosis or clubbing noted. Pedal hair growth normal.  Neurologic Normal speech. Oriented to person, place, and time. Epicritic sensation to light touch grossly present bilaterally.  Dermatologic Nails well groomed and normal in appearance. No open wounds. No skin lesions.  Orthopedic: Pain on palpation of right dorsal midfoot no pain at the Lisfranc interval.  No extensor tendinitis noted.  No pain at ankle joint noted.  Radiographs: None Assessment:   1. Arthritis of right midfoot    Plan:  Patient was evaluated and treated and all questions answered.  Right dorsal midfoot arthritis - All questions and concerns were discussed with the patient extensive detail given the amount of pain that he is experiencing benefit from a steroid injection of decreasing inflammatory components of shoulder pain.  Patient agrees with plan will to proceed with steroid injection -A steroid injection was performed at right dorsal midfoot using 1% plain Lidocaine  and 10 mg of Kenalog. This was well tolerated.   No follow-ups on file.

## 2024-04-22 NOTE — Progress Notes (Signed)
 Called patient and left a message for him to call back to get scheduled for labs.

## 2024-05-12 ENCOUNTER — Encounter: Attending: Physician Assistant | Admitting: Physician Assistant

## 2024-06-02 ENCOUNTER — Encounter: Attending: Physician Assistant | Admitting: Physician Assistant

## 2024-06-02 DIAGNOSIS — L98492 Non-pressure chronic ulcer of skin of other sites with fat layer exposed: Secondary | ICD-10-CM | POA: Diagnosis not present

## 2024-06-02 DIAGNOSIS — L02214 Cutaneous abscess of groin: Secondary | ICD-10-CM | POA: Diagnosis present

## 2024-06-22 ENCOUNTER — Encounter: Admitting: Physician Assistant

## 2024-06-22 DIAGNOSIS — L02214 Cutaneous abscess of groin: Secondary | ICD-10-CM | POA: Diagnosis not present

## 2024-06-27 ENCOUNTER — Ambulatory Visit
Admission: EM | Admit: 2024-06-27 | Discharge: 2024-06-27 | Disposition: A | Discharge status: Home / Self Care | Attending: Physician Assistant | Admitting: Physician Assistant

## 2024-06-27 ENCOUNTER — Ambulatory Visit: Payer: Self-pay | Admitting: Physician Assistant

## 2024-06-27 ENCOUNTER — Ambulatory Visit

## 2024-06-27 DIAGNOSIS — M19072 Primary osteoarthritis, left ankle and foot: Secondary | ICD-10-CM | POA: Insufficient documentation

## 2024-06-27 DIAGNOSIS — B1089 Other human herpesvirus infection: Secondary | ICD-10-CM | POA: Insufficient documentation

## 2024-06-27 DIAGNOSIS — F331 Major depressive disorder, recurrent, moderate: Secondary | ICD-10-CM | POA: Insufficient documentation

## 2024-06-27 DIAGNOSIS — F4312 Post-traumatic stress disorder, chronic: Secondary | ICD-10-CM | POA: Insufficient documentation

## 2024-06-27 DIAGNOSIS — J329 Chronic sinusitis, unspecified: Secondary | ICD-10-CM | POA: Insufficient documentation

## 2024-06-27 DIAGNOSIS — Z1211 Encounter for screening for malignant neoplasm of colon: Secondary | ICD-10-CM | POA: Insufficient documentation

## 2024-06-27 DIAGNOSIS — R42 Dizziness and giddiness: Secondary | ICD-10-CM | POA: Insufficient documentation

## 2024-06-27 DIAGNOSIS — M2011 Hallux valgus (acquired), right foot: Secondary | ICD-10-CM | POA: Insufficient documentation

## 2024-06-27 DIAGNOSIS — M79672 Pain in left foot: Secondary | ICD-10-CM | POA: Diagnosis not present

## 2024-06-27 DIAGNOSIS — R350 Frequency of micturition: Secondary | ICD-10-CM | POA: Insufficient documentation

## 2024-06-27 DIAGNOSIS — M7989 Other specified soft tissue disorders: Secondary | ICD-10-CM | POA: Diagnosis not present

## 2024-06-27 DIAGNOSIS — R32 Unspecified urinary incontinence: Secondary | ICD-10-CM | POA: Insufficient documentation

## 2024-06-27 DIAGNOSIS — K4090 Unilateral inguinal hernia, without obstruction or gangrene, not specified as recurrent: Secondary | ICD-10-CM | POA: Insufficient documentation

## 2024-06-27 DIAGNOSIS — R2689 Other abnormalities of gait and mobility: Secondary | ICD-10-CM | POA: Insufficient documentation

## 2024-06-27 LAB — URIC ACID: Uric Acid, Serum: 5.6 mg/dL (ref 3.7–8.6)

## 2024-06-27 MED ORDER — DEXAMETHASONE SOD PHOSPHATE PF 10 MG/ML IJ SOLN
10.0000 mg | Freq: Once | INTRAMUSCULAR | Status: AC
  Administered 2024-06-27: 10 mg via INTRAMUSCULAR

## 2024-06-27 MED ORDER — METHYLPREDNISOLONE 4 MG PO TBPK: ORAL_TABLET | ORAL | 0 refills | Status: AC

## 2024-06-27 NOTE — ED Triage Notes (Signed)
 Patient presents to [facility] for left foot swelling and pain x 3 days. He believes it is an arthritis flare-up. Not taking any OTC meds.

## 2024-06-27 NOTE — Discharge Instructions (Addendum)
-   You are checking a uric acid level.  If this is elevated indicates gout.  If it is not elevated gout is not 100% ruled out. - We gave you a corticosteroid injection today and you start the corticosteroid pills tomorrow.  This is the same treatment you received earlier this year when you had similar symptoms. -I will call you if the radiologist notes anything other than arthritis on your x-ray. - Try to avoid red meat, seafood and alcohol. - Elevate and ice foot.  May take Tylenol  as needed for pain relief. - Follow-up with PCP.

## 2024-06-27 NOTE — ED Provider Notes (Signed)
 " [facility]    CSN: [id number] Arrival date & time: 06/27/24  1113      History   Chief Complaint Chief Complaint  Patient presents with   Foot Pain    HPI Brandon Wheeler is a 78 y.o. male presenting for approximately 3-day history of left foot and ankle pain and swelling.  Patient denies any injuries.  He does have a history of arthritis in the opposite foot and thinks he is likely having a flareup of arthritis in the left foot.  He has not taken any OTC meds or treated condition in any way.  He denies any history of gout.  No wounds, fever, abscesses or contusions.  Of note, patient was seen here earlier this year for pain in the opposite foot and was given dexamethasone  injection and Medrol  Dosepak.  He says that improved his symptoms very quickly and he would like the same treatment.  HPI  Past Medical History:  Diagnosis Date   BPH (benign prostatic hyperplasia)    Diverticulitis    Hyperlipidemia    Insomnia    PTSD (post-traumatic stress disorder)    Sleep apnea     Patient Active Problem List   Diagnosis Date Noted   Chronic sinusitis 06/27/2024   Dizziness and giddiness 06/27/2024   Hallux valgus (acquired), right foot 06/27/2024   Frequency of urination 06/27/2024   Inguinal hernia 06/27/2024   Leaking of urine 06/27/2024   Moderate episode of recurrent major depressive disorder (HCC) 06/27/2024   Other abnormalities of gait and mobility 06/27/2024   Other human herpesvirus infection 06/27/2024   Encounter for screening for malignant neoplasm of colon 06/27/2024   Chronic post-traumatic stress disorder (PTSD) 06/27/2024   Morbid obesity (HCC) 04/17/2024   Mixed hyperlipidemia 04/09/2023   Benign prostatic hyperplasia 04/09/2023   OSA (obstructive sleep apnea) 04/09/2023   Exposure to potentially hazardous substance 05/28/2021   Necrotizing fasciitis (HCC) 06/30/1980   Low back pain 06/30/1964    Past Surgical History:  Procedure Laterality  Date   BLEPHAROPLASTY     CATARACT EXTRACTION W/PHACO Right 01/30/2020   Procedure: CATARACT EXTRACTION PHACO AND INTRAOCULAR LENS PLACEMENT (IOC) RIGHT;  Surgeon: Myrna Adine Anes, MD;  Location: [facility];  Service: Ophthalmology;  Laterality: Right;  2.20 0:27.0   CATARACT EXTRACTION W/PHACO Left 03/26/2020   Procedure: CATARACT EXTRACTION PHACO AND INTRAOCULAR LENS PLACEMENT (IOC) LEFT 2.47  00:31.8;  Surgeon: Myrna Adine Anes, MD;  Location: [facility];  Service: Ophthalmology;  Laterality: Left;   DENTAL SURGERY     Implants   FASCIOTOMY     HERNIA REPAIR     skin grafts         Home Medications    Prior to Admission medications  Medication Sig Start Date End Date Taking? Authorizing Provider  methylPREDNISolone  (MEDROL  DOSEPAK) 4 MG TBPK tablet Take p.o. according to Dosepak instructions 06/27/24  Yes Arvis Jolan NOVAK, PA-C  acetaminophen  (TYLENOL ) 325 MG tablet Take 975 mg by mouth. 10/06/23   [provider]  capsicum (ZOSTRIX) 0.075 % topical cream Apply topically. 10/30/23   [provider]  Carboxymethylcellulose Sod PF 0.5 % SOLN Apply to eye. 10/06/23   [provider]  cetirizine (ZYRTEC) 10 MG tablet Take 10 mg by mouth daily.    [provider]  clotrimazole (LOTRIMIN) 1 % cream Apply 1 Application topically 2 (two) times daily. Patient not taking: Reported on 04/12/2024    [provider]  diclofenac Sodium (VOLTAREN)  1 % GEL Apply topically. 09/11/23   [provider]  Docusate Sodium (DSS) 100 MG CAPS Take 100 mg by mouth. 10/06/23   [provider]  erythromycin ophthalmic ointment Apply to eye. 12/19/23   [provider]  finasteride  (PROSCAR ) 5 MG tablet Take 1 tablet by mouth daily. 04/30/11   [provider]  fluticasone (FLONASE) 50 MCG/ACT nasal spray Place into the nose. 10/06/23   [provider]  neomycin-polymyxin b-dexamethasone  (MAXITROL) 3.5-10000-0.1  SUSP 1 drop 2 (two) times daily. 12/10/23   [provider]  Polyethylene Glycol 3350 POWD Take by mouth. 06/16/23   [provider]  simvastatin (ZOCOR) 80 MG tablet Take 40 mg by mouth. 10/06/23   [provider]  tamsulosin  (FLOMAX ) 0.4 MG CAPS capsule Take 0.4 mg by mouth at bedtime. 10/06/23   [provider]  valACYclovir (VALTREX) 500 MG tablet Take 500 mg by mouth daily. 11/12/23   [provider]  VALTREX 1 g tablet Take 1,000 mg by mouth 3 (three) times daily. 11/03/23   [provider]  zolpidem  (AMBIEN ) 10 MG tablet Take 10 mg by mouth at bedtime.     [provider]    Family History Family History  Problem Relation Age of Onset   Ovarian cancer Mother    Hypertension Mother    CAD Father     Social History Social History[1]   Allergies   Patient has no known allergies.   Review of Systems Review of Systems  Musculoskeletal:  Positive for arthralgias, gait problem (using cane) and joint swelling.  Skin:  Negative for color change, rash and wound.  Neurological:  Negative for weakness and numbness.     Physical Exam Triage Vital Signs ED Triage Vitals  Encounter Vitals Group     BP 06/27/24 1242 (!) 156/92     Girls Systolic BP Percentile --      Girls Diastolic BP Percentile --      Boys Systolic BP Percentile --      Boys Diastolic BP Percentile --      Pulse Rate 06/27/24 1242 76     Resp 06/27/24 1242 20     Temp 06/27/24 1242 98.1 F (36.7 C)     Temp Source 06/27/24 1242 Oral     SpO2 06/27/24 1242 95 %     Weight --      Height --      Head Circumference --      Peak Flow --      Pain Score 06/27/24 1243 8     Pain Loc --      Pain Education --      Exclude from Growth Chart --    No data found.  Updated Vital Signs BP (!) 156/92 (BP Location: Right Arm)   Pulse 76   Temp 98.1 F (36.7 C) (Oral)   Resp 20   SpO2 95%   Physical Exam Vitals and nursing note reviewed.   Constitutional:      General: He is not in acute distress.    Appearance: Normal appearance. He is well-developed. He is obese. He is not ill-appearing.  HENT:     Head: Normocephalic and atraumatic.  Eyes:     Conjunctiva/sclera: Conjunctivae normal.  Cardiovascular:     Rate and Rhythm: Normal rate and regular rhythm.  Pulmonary:     Effort: Pulmonary effort is normal. No respiratory distress.  Musculoskeletal:     Cervical back: Neck supple.  Comments: Left ankle/foot: There is moderate swelling of the ankle, hindfoot, and midfoot.  Generalized tenderness and ankle, hindfoot and midfoot.  Reduced range of motion of ankle.  Faint erythema associated with swelling.  Good pulses.  Skin:    General: Skin is warm and dry.     Capillary Refill: Capillary refill takes less than 2 seconds.  Neurological:     General: No focal deficit present.     Mental Status: He is alert. Mental status is at baseline.     Motor: No weakness.     Gait: Gait abnormal (using cane).  Psychiatric:        Mood and Affect: Mood normal.      UC Treatments / Results  Labs (all labs ordered are listed, but only abnormal results are displayed) Labs Reviewed  URIC ACID    EKG   Radiology No results found.  Procedures Procedures (including critical care time)  Medications Ordered in UC Medications  dexamethasone  (DECADRON ) injection 10 mg (10 mg Intramuscular Given 06/27/24 1326)    Initial Impression / Assessment and Plan / UC Course  I have reviewed the triage vital signs and the nursing notes.  Pertinent labs & imaging results that were available during my care of the patient were reviewed by me and considered in my medical decision making (see chart for details).   78 year old male presents for left foot and ankle pain and swelling x 3 days.  History of similar symptoms in the opposite foot earlier this year.  Treated with corticosteroids and symptoms improved.  History of arthritis but  denies any history of gout.  X-ray obtained.  We we will check uric acid level.  Possible gout.  His renal function has mildly weakened recently.  Could have gout secondary to renal insufficiency.  Patient given 10 mg IM dexamethasone .  Sent Medrol  Dosepak.  Also advised Tylenol , ice, elevation.  Advise following up with Ortho or podiatry.   *** Final Clinical Impressions(s) / UC Diagnoses   Final diagnoses:  Left foot pain  Swelling of left foot     Discharge Instructions      - You are checking a uric acid level.  If this is elevated indicates gout.  If it is not elevated gout is not 100% ruled out. - We gave you a corticosteroid injection today and you start the corticosteroid pills tomorrow.  This is the same treatment you received earlier this year when you had similar symptoms. -I will call you if the radiologist notes anything other than arthritis on your x-ray. - Try to avoid red meat, seafood and alcohol. - Elevate and ice foot.  May take Tylenol  as needed for pain relief. - Follow-up with PCP.    ED Prescriptions     Medication Sig Dispense Auth. Provider   methylPREDNISolone  (MEDROL  DOSEPAK) 4 MG TBPK tablet Take p.o. according to Dosepak instructions 21 tablet Arvis Jolan NOVAK, PA-C      PDMP not reviewed this encounter.    [1]  Social History Tobacco Use   Smoking status: Former    Current packs/day: 0.00    Types: Cigarettes    Quit date: 1980    Years since quitting: 46.0   Smokeless tobacco: Never  Vaping Use   Vaping status: Never Used  Substance Use Topics   Alcohol use: Yes    Comment: rare   Drug use: No   "

## 2024-07-03 ENCOUNTER — Encounter: Payer: Self-pay | Admitting: Emergency Medicine

## 2024-07-03 ENCOUNTER — Ambulatory Visit
Admission: EM | Admit: 2024-07-03 | Discharge: 2024-07-03 | Disposition: A | Discharge status: Home / Self Care | Attending: Emergency Medicine | Admitting: Emergency Medicine

## 2024-07-03 DIAGNOSIS — L03221 Cellulitis of neck: Secondary | ICD-10-CM | POA: Diagnosis not present

## 2024-07-03 DIAGNOSIS — M79672 Pain in left foot: Secondary | ICD-10-CM

## 2024-07-03 MED ORDER — DOXYCYCLINE HYCLATE 100 MG PO CAPS: 100.0000 mg | ORAL_CAPSULE | Freq: Two times a day (BID) | ORAL | 0 refills | Status: AC

## 2024-07-03 NOTE — ED Triage Notes (Signed)
 Patient was seen here this past Monday for left foot pain.  Patient reports ongoing left foot pain that has not improved.

## 2024-07-03 NOTE — Discharge Instructions (Signed)
 You have a bone spur along the area of pain as read by me.  There is no fracture on x-ray.  I would recommend over-the-counter lidocaine  patches, 1000 g Tylenol  3 4 times a day as needed for pain.  Please follow-up with [facility] ASAP.  Finish the antibiotics, even if you feel better.  Warm compresses to the area will help with healing and pain.  Go to the ER if this gets significantly worse or you start having fevers

## 2024-07-03 NOTE — ED Provider Notes (Signed)
 HPI  SUBJECTIVE:  Brandon Wheeler is a 79 y.o. male who presents with persistent medial left midfoot pain present for the past 2 weeks.  It is constant, burning, achy, sharp and stabbing.  He reports difficulty walking due to the pain.  He had swelling at first, but this resolved with steroids.  No erythema, fevers, change in footwear, change in physical activity, trauma to the foot.  He denies ankle injury or pain.  He states that the dexamethasone  helped, and has been taking Advil in addition to the Medrol  Dosepak without improvement.  Symptoms are worse with walking.  He was seen here 5 days ago with left foot pain, had an x-ray that showed mild first MTP osteoarthritis .  He was given 10 mg dexamethasone  IM, sent home with Medrol  Dosepak, Tylenol , ice, elevation.  Advised follow-up with orthopedics podiatry.  Uric acid was normal.  He also reports painful erythematous mass on his right lateral neck with a central scab starting 3 days ago.  No drainage, fevers.  He states this started off as a pimple.  He has not tried anything for this.  Symptoms are worse with palpation.  He has a past medical history of bilateral foot arthritis, peripheral neuropathy, remote history of necrotizing fasciitis.  No history of gout, diabetes, MRSA, formal diagnosis of hypertension, cancer.  PCP: [facility].  Podiatry: [facility] in [place].  Past Medical History:  Diagnosis Date   BPH (benign prostatic hyperplasia)    Diverticulitis    Hyperlipidemia    Insomnia    PTSD (post-traumatic stress disorder)    Sleep apnea     Past Surgical History:  Procedure Laterality Date   BLEPHAROPLASTY     CATARACT EXTRACTION W/PHACO Right 01/30/2020   Procedure: CATARACT EXTRACTION PHACO AND INTRAOCULAR LENS PLACEMENT (IOC) RIGHT;  Surgeon: Myrna Adine Anes, MD;  Location: [facility];  Service: Ophthalmology;  Laterality: Right;  2.20 0:27.0   CATARACT EXTRACTION W/PHACO Left 03/26/2020   Procedure:  CATARACT EXTRACTION PHACO AND INTRAOCULAR LENS PLACEMENT (IOC) LEFT 2.47  00:31.8;  Surgeon: Myrna Adine Anes, MD;  Location: [facility];  Service: Ophthalmology;  Laterality: Left;   DENTAL SURGERY     Implants   FASCIOTOMY     HERNIA REPAIR     skin grafts      Family History  Problem Relation Age of Onset   Ovarian cancer Mother    Hypertension Mother    CAD Father     Social History[1]  Current Medications[2]  Allergies[3]   ROS  As noted in HPI.   Physical Exam  BP (!) 158/103 (BP Location: Right Arm)   Pulse 77   Temp 98.2 F (36.8 C) (Oral)   Resp 16   Ht 5' 11.5 (1.816 m)   Wt (!) 143.4 kg   SpO2 95%   BMI 43.48 kg/m   Constitutional: Well developed, well nourished, no acute distress Eyes:  EOMI, conjunctiva normal bilaterally HENT: Normocephalic, atraumatic,mucus membranes moist Respiratory: Normal inspiratory effort Cardiovascular: Normal rate GI: nondistended skin: 0.5 x 0.5 tender area of erythema, edema with central scab on right lateral/anterior neck.  No expressible purulent drainage  Musculoskeletal: no deformities.  No erythema, swelling. Left medial midfoot tender. Base of fifth metatarsal NT.  Calcaneus NT. no bruising. Skin intact. DP 2+. Refill less than 2 seconds. Sensation grossly intact. Patient able to move all toes actively.  Pain with inversion and plantarflexion.no pain with eversion,  dorsiflexion.  Positive tenderness along the  plantar fascia anterior to the heel. Distal fibula NT, Medial malleolus NT,  Deltoid ligament NT, Lateral ligaments NT, Achilles NT. Patient unable to bear weight while in department.  Neurologic: Alert & oriented x 3, no focal neuro deficits Psychiatric: Speech and behavior appropriate   ED Course   Medications - No data to display  No orders of the defined types were placed in this encounter.   No results found for this or any previous visit (from the past 24 hours). No results  found. Narrative & Impression  EXAM: 3 VIEW(S) XRAY OF THE LEFT FOOT 06/27/2024 12:56:31 PM   COMPARISON: None available.   CLINICAL HISTORY: Foot pain and swelling x 3 days. No injury.   FINDINGS:   BONES AND JOINTS: No acute fracture. No malalignment. Prominent superior calcaneal spur. Mild osteoarthritis of first metatarsophalangeal joint.   SOFT TISSUES: Mild diffuse soft tissue swelling.   IMPRESSION: 1. No acute osseous abnormality. 2. Mild first metatarsophalangeal osteoarthritis with mild diffuse soft tissue swelling.   Electronically signed by: Michaeline Blanch MD 06/27/2024 02:16 PM EST RP Workstation: [id number]    ED Clinical Impression  1. Foot pain, left   2. Cellulitis, neck      ED Assessment/Plan    Previous records outside labs reviewed.  As noted in HPI.  1.  Foot pain.  Offered to get repeat x-ray due to persistent symptoms, but patient declined.  I feel this is reasonable in the absence of trauma.    Reviewed imaging from earlier this week independently.  No fracture of the calcaneus, talus, and navicular, midfoot.  Bone spur along navicular as read by me.  Offered prescription of lidocaine  patch, 1000 g Tylenol  3 times daily as needed for pain.  Patient declined prescriptions and states that he will arrange follow-up with podiatry tomorrow.  2.  Cellulitis.  Patient has a small cellulitis of the right neck.  Discussed with patient that I am not going to drain this due to its location.  Will send home with doxycycline  100 mg p.o. twice daily for 1 week, advised warm compresses.  Follow-up with PCP as needed.  He is to go to the ED if it gets worse  Calculated creatinine clearance based on labs done in October 95 mL/min.  Discussed  imaging, MDM, treatment plan, and plan for follow-up with patient. Discussed sn/sx that should prompt return to the ED. patient agrees with plan.   Meds ordered this encounter  Medications   doxycycline  (VIBRAMYCIN )  100 MG capsule    Sig: Take 1 capsule (100 mg total) by mouth 2 (two) times daily for 7 days.    Dispense:  14 capsule    Refill:  0      *This clinic note was created using Scientist, clinical (histocompatibility and immunogenetics). Therefore, there may be occasional mistakes despite careful proofreading.  ?     [1]  Social History Tobacco Use   Smoking status: Former    Current packs/day: 0.00    Types: Cigarettes    Quit date: 1980    Years since quitting: 46.0   Smokeless tobacco: Never  Vaping Use   Vaping status: Never Used  Substance Use Topics   Alcohol use: Yes    Comment: rare   Drug use: No  [2] No current facility-administered medications for this encounter.  Current Outpatient Medications:    doxycycline  (VIBRAMYCIN ) 100 MG capsule, Take 1 capsule (100 mg total) by mouth 2 (two) times daily for 7 days., Disp: 14 capsule, Rfl:  0   acetaminophen  (TYLENOL ) 325 MG tablet, Take 975 mg by mouth., Disp: , Rfl:    capsicum (ZOSTRIX) 0.075 % topical cream, Apply topically., Disp: , Rfl:    Carboxymethylcellulose Sod PF 0.5 % SOLN, Apply to eye., Disp: , Rfl:    cetirizine (ZYRTEC) 10 MG tablet, Take 10 mg by mouth daily., Disp: , Rfl:    clotrimazole (LOTRIMIN) 1 % cream, Apply 1 Application topically 2 (two) times daily. (Patient not taking: Reported on 04/12/2024), Disp: , Rfl:    diclofenac Sodium (VOLTAREN) 1 % GEL, Apply topically., Disp: , Rfl:    Docusate Sodium (DSS) 100 MG CAPS, Take 100 mg by mouth., Disp: , Rfl:    erythromycin ophthalmic ointment, Apply to eye., Disp: , Rfl:    finasteride  (PROSCAR ) 5 MG tablet, Take 1 tablet by mouth daily., Disp: , Rfl:    fluticasone (FLONASE) 50 MCG/ACT nasal spray, Place into the nose., Disp: , Rfl:    methylPREDNISolone  (MEDROL  DOSEPAK) 4 MG TBPK tablet, Take p.o. according to Dosepak instructions, Disp: 21 tablet, Rfl: 0   neomycin-polymyxin b-dexamethasone  (MAXITROL) 3.5-10000-0.1 SUSP, 1 drop 2 (two) times daily., Disp: , Rfl:    Polyethylene  Glycol 3350 POWD, Take by mouth., Disp: , Rfl:    simvastatin (ZOCOR) 80 MG tablet, Take 40 mg by mouth., Disp: , Rfl:    tamsulosin  (FLOMAX ) 0.4 MG CAPS capsule, Take 0.4 mg by mouth at bedtime., Disp: , Rfl:    valACYclovir (VALTREX) 500 MG tablet, Take 500 mg by mouth daily., Disp: , Rfl:    VALTREX 1 g tablet, Take 1,000 mg by mouth 3 (three) times daily., Disp: , Rfl:    zolpidem  (AMBIEN ) 10 MG tablet, Take 10 mg by mouth at bedtime. , Disp: , Rfl:  [3] No Known Allergies    Van Knee, MD 07/04/24 [phone]

## 2024-07-04 ENCOUNTER — Emergency Department

## 2024-07-04 ENCOUNTER — Other Ambulatory Visit: Payer: Self-pay

## 2024-07-04 ENCOUNTER — Emergency Department
Admission: EM | Admit: 2024-07-04 | Discharge: 2024-07-04 | Disposition: A | Discharge status: Home / Self Care | Attending: Emergency Medicine | Admitting: Emergency Medicine

## 2024-07-04 DIAGNOSIS — R42 Dizziness and giddiness: Secondary | ICD-10-CM | POA: Diagnosis present

## 2024-07-04 DIAGNOSIS — L03221 Cellulitis of neck: Secondary | ICD-10-CM | POA: Diagnosis not present

## 2024-07-04 DIAGNOSIS — D72829 Elevated white blood cell count, unspecified: Secondary | ICD-10-CM | POA: Insufficient documentation

## 2024-07-04 DIAGNOSIS — Z85038 Personal history of other malignant neoplasm of large intestine: Secondary | ICD-10-CM | POA: Diagnosis not present

## 2024-07-04 DIAGNOSIS — I4891 Unspecified atrial fibrillation: Secondary | ICD-10-CM | POA: Diagnosis not present

## 2024-07-04 LAB — URINALYSIS, ROUTINE W REFLEX MICROSCOPIC
Bilirubin Urine: NEGATIVE
Glucose, UA: NEGATIVE mg/dL
Hgb urine dipstick: NEGATIVE
Ketones, ur: NEGATIVE mg/dL
Leukocytes,Ua: NEGATIVE
Nitrite: NEGATIVE
Protein, ur: NEGATIVE mg/dL
Specific Gravity, Urine: 1.008 (ref 1.005–1.030)
pH: 5 (ref 5.0–8.0)

## 2024-07-04 LAB — COMPREHENSIVE METABOLIC PANEL WITH GFR
ALT: 15 U/L (ref 0–44)
AST: 20 U/L (ref 15–41)
Albumin: 3.7 g/dL (ref 3.5–5.0)
Alkaline Phosphatase: 89 U/L (ref 38–126)
Anion gap: 9 (ref 5–15)
BUN: 22 mg/dL (ref 8–23)
CO2: 28 mmol/L (ref 22–32)
Calcium: 9.3 mg/dL (ref 8.9–10.3)
Chloride: 102 mmol/L (ref 98–111)
Creatinine, Ser: 1.14 mg/dL (ref 0.61–1.24)
GFR, Estimated: >60 mL/min
Glucose, Bld: 115 mg/dL — ABNORMAL HIGH (ref 70–99)
Potassium: 4.1 mmol/L (ref 3.5–5.1)
Sodium: 138 mmol/L (ref 135–145)
Total Bilirubin: 0.6 mg/dL (ref 0.0–1.2)
Total Protein: 7.3 g/dL (ref 6.5–8.1)

## 2024-07-04 LAB — CBC
HCT: 45.1 % (ref 39.0–52.0)
Hemoglobin: 15.1 g/dL (ref 13.0–17.0)
MCH: 31.3 pg (ref 26.0–34.0)
MCHC: 33.5 g/dL (ref 30.0–36.0)
MCV: 93.6 fL (ref 80.0–100.0)
Platelets: 265 K/uL (ref 150–400)
RBC: 4.82 MIL/uL (ref 4.22–5.81)
RDW: 13.8 % (ref 11.5–15.5)
WBC: 14.3 K/uL — ABNORMAL HIGH (ref 4.0–10.5)
nRBC: 0 % (ref 0.0–0.2)

## 2024-07-04 NOTE — ED Triage Notes (Signed)
 First nurse note: pt to ED [facility] from home for dizziness noticed when woke up. VSS with EMS. Cbg 142

## 2024-07-04 NOTE — Discharge Instructions (Addendum)
 You have been diagnosed with dizziness, A-fib, neck cellulitis.  Please continue taking doxycycline  1 tablet every 12 hours.  Please do not drink antibiotic with meals.  Please do not lay down after 30 minutes of taking antibiotics.  Please call and make an appointment with your cardiologist for a follow-up of your dizziness.  Please come back to ED or go to your PCP if symptoms symptoms worsen.

## 2024-07-04 NOTE — ED Triage Notes (Addendum)
 C/O seen by urgent  care yesterday for elevated BP, no change in RX. Given antibiotics for reddened area to right neck.  STates not feeling right.  C/O  dizziness/lightheaded x 1 day at least  MAE equally and strong.  Speech clear. NAD  Reports being under a lot of stress lately currently caring for wife.  Also c/o chills lately.

## 2024-07-04 NOTE — ED Provider Notes (Signed)
 ----------------------------------------- 4:22 PM on 07/04/2024 -----------------------------------------  Blood pressure (!) 139/96, pulse 100, temperature 98.4 F (36.9 C), temperature source Oral, resp. rate 16, weight (!) 143.4 kg, SpO2 100%.  Assuming care from [facility], PA-C/NP-C.  In short, Brandon Wheeler is a 79 y.o. male with a chief complaint of Dizziness .  Refer to the original H&P for additional details.  The current plan of care is to wait for results of CT and EKG to determine disposition..  ____________________________________________    ED Results / Procedures / Treatments   Labs (all labs ordered are listed, but only abnormal results are displayed) Labs Reviewed  COMPREHENSIVE METABOLIC PANEL WITH GFR - Abnormal; Notable for the following components:      Result Value   Glucose, Bld 115 (*)    All other components within normal limits  CBC - Abnormal; Notable for the following components:   WBC 14.3 (*)    All other components within normal limits  URINALYSIS, ROUTINE W REFLEX MICROSCOPIC - Abnormal; Notable for the following components:   Color, Urine STRAW (*)    APPearance CLEAR (*)    All other components within normal limits  CBG MONITORING, ED     EKG A-fib   RADIOLOGY I personally viewed and evaluated these images as part of my medical decision making, as well as reviewing the written report by the radiologist.  ED Provider Interpretation:   CT Head Wo Contrast Result Date: 07/04/2024 CLINICAL DATA:  Dizziness. EXAM: CT HEAD WITHOUT CONTRAST TECHNIQUE: Contiguous axial images were obtained from the base of the skull through the vertex without intravenous contrast. RADIATION DOSE REDUCTION: This exam was performed according to the departmental dose-optimization program which includes automated exposure control, adjustment of the mA and/or kV according to patient size and/or use of iterative reconstruction technique. COMPARISON:  None Available.  FINDINGS: Brain: There is generalized cerebral atrophy with widening of the extra-axial spaces and ventricular dilatation. There are areas of decreased attenuation within the white matter tracts of the supratentorial brain, consistent with microvascular disease changes. Vascular: Mild bilateral cavernous carotid artery calcification is noted. Skull: Normal. Negative for fracture or focal lesion. Sinuses/Orbits: No acute finding. Other: None. IMPRESSION: 1. Generalized cerebral atrophy and microvascular disease changes of the supratentorial brain. 2. No acute intracranial abnormality. Electronically Signed   By: Suzen Dials M.D.   On: 07/04/2024 16:45     PROCEDURES:  Critical Care performed: No  Procedures   MEDICATIONS ORDERED IN ED: Medications - No data to display   IMPRESSION / MDM / ASSESSMENT AND PLAN / ED COURSE  I reviewed the triage vital signs and the nursing notes.              On a physical exam, patient was hypertensive.  There is presence of erythema in the left side of the nasal bridge, tender to palpation.  No fluctuance.  Presence of red induration.  Scab in the center, in the right lower side of the neck.  No tenderness, no warmness, no secretions.  Rest of physical exam within normal limits                Differential diagnosis includes, but is not limited to, A-fib, electrolyte derangement, UTI, anemia, dehydration, intracranial hemorrhage, intracranial mass, cervical cellulitis  Patient's presentation is most consistent with acute complicated illness / injury requiring diagnostic workup.  Independent chart review patient i was prescribed yesterday with doxycycline  for neck cellulitis.  Patient had first doses today.  I did  advise patient to keep taking the antibiotic to treat the infection that is producing leukocytosis. Patient's diagnosis is consistent with neck cellulitis, A-fib, dizziness. Patient will be discharged home without prescriptions. Patient is to  follow up with cardiologist at the [facility] as needed or otherwise directed. Patient is given ED precautions to return to the ED for any worsening or new symptoms. Patient left before rechecking orthostatic blood pressure.  Clinical Course as of 07/04/24 1721  Mon Jul 04, 2024  1622 Urinalysis, Routine w reflex microscopic -Urine, Clean Catch(!) Negative for UTI [AE]  1622 Comprehensive metabolic panel(!) Electrolytes, renal function and liver function within normal limits [AE]  1623 CBC(!) White blood cells elevated 14.3, hemoglobin and platelets within normal limit [AE]  1623 ED EKG A-fib [AE]  1649 CT Head Wo Contrast . Generalized cerebral atrophy and microvascular disease changes of the supratentorial brain. 2. No acute intracranial abnormality.   [AE]    Clinical Course User Index [AE] Janit Kast, PA-C    FINAL CLINICAL IMPRESSION(S) / ED DIAGNOSES   Final diagnoses:  Atrial fibrillation, unspecified type (HCC)  Dizziness  Cellulitis, neck     Rx / DC Orders   ED Discharge Orders     None        Note:  This document was prepared using Dragon voice recognition software and may include unintentional dictation errors.    Janit Kast, PA-C 07/04/24 1721    Willo Dunnings, MD 07/04/24 [phone]

## 2024-07-04 NOTE — ED Notes (Signed)
 Pt given a remote

## 2024-07-04 NOTE — ED Provider Notes (Signed)
 "  [facility] Provider Note    Event Date/Time   First MD Initiated Contact with Patient 07/04/24 1422     (approximate)   History   Dizziness   HPI  Brandon Wheeler is a 79 y.o. male with history of hyperlipidemia, morbid obesity, dizziness, colon cancer, necrotizing fasciitis and as listed in EMR presents to the emergency department for treatment and evaluation of dizziness.  Patient states that today he stood up with the intent to go play golf and upon standing became dizzy.  He sat back down and dizziness resolved.  He has had no associated weakness or residual symptoms but just does not feel right.  He also has a tender area on the right side of his neck.  He was evaluated at urgent care yesterday and was prescribed antibiotics but he has not started taking those yet.  He is also concerned that his blood pressure has been elevated.  He does admit to being in a stressful situation because he is caring for his wife.     Physical Exam    Vitals:   07/04/24 1113 07/04/24 1430  BP: (!) 139/96   Pulse: 100   Resp: 16   Temp: 98.4 F (36.9 C)   SpO2: 98% 100%    General: Awake, no distress.  CV:  Good peripheral perfusion.  Resp:  Normal effort.  Abd:  No distention.  Other:  Indurated, erythematous lesion on right lower, lateral neck with central scabbed area.   ED Results / Procedures / Treatments   Labs (all labs ordered are listed, but only abnormal results are displayed)  Labs Reviewed  COMPREHENSIVE METABOLIC PANEL WITH GFR - Abnormal; Notable for the following components:      Result Value   Glucose, Bld 115 (*)    All other components within normal limits  CBC - Abnormal; Notable for the following components:   WBC 14.3 (*)    All other components within normal limits  URINALYSIS, ROUTINE W REFLEX MICROSCOPIC  CBG MONITORING, ED     EKG  Atrial fibrillation with rate of 90. No recent for comparison   RADIOLOGY  Image and  radiology report reviewed and interpreted by me. Radiology report consistent with the same.  Pending  PROCEDURES:  Critical Care performed: No  Procedures   MEDICATIONS ORDERED IN ED:  Medications - No data to display   IMPRESSION / MDM / ASSESSMENT AND PLAN / ED COURSE   I have reviewed the triage note and vital signs. Vital signs are stable.   Differential diagnosis includes, but is not limited to, electrolyte imbalance, side effect of medication, CVA/TIA, cardiac event  Patient's presentation is most consistent with acute presentation with potential threat to life or bodily function.  79 year old male presenting to the emergency department for treatment and evaluation of dizziness upon standing earlier today.  He has had no residual symptoms since the brief incident.  Upon chart review, it appears that he had similar symptoms in July 2025 and presented to the emergency department at the [facility] where he had a negative workup.  They attributed his dizziness to potential dehydration and encouraged him to consume more water.  Plan will be to get an EKG, review labs already drawn, and get a CT scan of his head in addition to orthostatic vital signs.  EKG shows A-fib with a rate of 90.  I am unable to find any documentation that indicates he has a history of atrial fibrillation and  I do not see that he is on any anticoagulants.  I also do not see an EKG from the ER visit at the [place] in July.  Case discussed with ED attending, Dr. Arlander who recommends orthostatic vital signs to ensure that he is able to stand without excessive dizziness and then get him connected with cardiology as an outpatient.  Awaiting review of orthostatic vital signs and head CT.  Care will be transferred to Marolyn Sar, PA-C who will follow-up on results and determine disposition.      FINAL CLINICAL IMPRESSION(S) / ED DIAGNOSES   Final diagnoses:  Atrial fibrillation, unspecified type (HCC)  Dizziness      Rx / DC Orders   ED Discharge Orders     None        Note:  This document was prepared using Dragon voice recognition software and may include unintentional dictation errors.   Herlinda Kirk NOVAK, FNP 07/04/24 1523    Arlander [place], MD 07/05/24 [phone]  "

## 2024-07-04 NOTE — ED Notes (Signed)
 Pt left without DC paperwork. I called pt to see if he wanted it and he apologized and said he thought he was good to go. Pt stated his ride was here and so he left. Provider made aware.

## 2024-07-04 NOTE — ED Notes (Signed)
Pt encouraged to give a urine sample.  

## 2024-07-06 ENCOUNTER — Encounter: Attending: Physician Assistant | Admitting: Physician Assistant

## 2024-07-06 DIAGNOSIS — L98492 Non-pressure chronic ulcer of skin of other sites with fat layer exposed: Secondary | ICD-10-CM | POA: Insufficient documentation

## 2024-07-06 DIAGNOSIS — L02214 Cutaneous abscess of groin: Secondary | ICD-10-CM | POA: Insufficient documentation

## 2024-07-14 ENCOUNTER — Ambulatory Visit: Admitting: Podiatry

## 2024-07-14 DIAGNOSIS — M19072 Primary osteoarthritis, left ankle and foot: Secondary | ICD-10-CM | POA: Diagnosis not present

## 2024-07-14 NOTE — Progress Notes (Signed)
 "  Subjective:  Patient ID: Brandon Wheeler, male    DOB: 07-02-1945,  MRN: [id number]  Chief Complaint  Patient presents with   Foot Pain    Left foot pain     79 y.o. male presents with the above complaint.  Patient presents with mild left midfoot arthritis pain.  The right side is doing pretty good he would like to discuss steroid injection of the left side denies any other acute complaints.   Review of Systems: Negative except as noted in the HPI. Denies N/V/F/Ch.  Past Medical History:  Diagnosis Date   BPH (benign prostatic hyperplasia)    Diverticulitis    Hyperlipidemia    Insomnia    PTSD (post-traumatic stress disorder)    Sleep apnea     Current Outpatient Medications:    acetaminophen  (TYLENOL ) 325 MG tablet, Take 975 mg by mouth., Disp: , Rfl:    capsicum (ZOSTRIX) 0.075 % topical cream, Apply topically., Disp: , Rfl:    Carboxymethylcellulose Sod PF 0.5 % SOLN, Apply to eye., Disp: , Rfl:    cetirizine (ZYRTEC) 10 MG tablet, Take 10 mg by mouth daily. (Patient not taking: Reported on 07/04/2024), Disp: , Rfl:    clotrimazole (LOTRIMIN) 1 % cream, Apply 1 Application topically 2 (two) times daily. (Patient not taking: No sig reported), Disp: , Rfl:    diclofenac Sodium (VOLTAREN) 1 % GEL, Apply topically., Disp: , Rfl:    Docusate Sodium (DSS) 100 MG CAPS, Take 100 mg by mouth., Disp: , Rfl:    erythromycin ophthalmic ointment, Apply to eye. (Patient not taking: Reported on 07/04/2024), Disp: , Rfl:    finasteride  (PROSCAR ) 5 MG tablet, Take 1 tablet by mouth daily., Disp: , Rfl:    fluticasone (FLONASE) 50 MCG/ACT nasal spray, Place into the nose., Disp: , Rfl:    methylPREDNISolone  (MEDROL  DOSEPAK) 4 MG TBPK tablet, Take p.o. according to Dosepak instructions (Patient not taking: Reported on 07/04/2024), Disp: 21 tablet, Rfl: 0   neomycin-polymyxin b-dexamethasone  (MAXITROL) 3.5-10000-0.1 SUSP, 1 drop 2 (two) times daily., Disp: , Rfl:    Polyethylene Glycol 3350 POWD, Take  by mouth., Disp: , Rfl:    simvastatin (ZOCOR) 80 MG tablet, Take 40 mg by mouth., Disp: , Rfl:    tamsulosin  (FLOMAX ) 0.4 MG CAPS capsule, Take 0.4 mg by mouth at bedtime., Disp: , Rfl:    valACYclovir (VALTREX) 500 MG tablet, Take 500 mg by mouth daily. (Patient not taking: Reported on 07/04/2024), Disp: , Rfl:    VALTREX 1 g tablet, Take 1,000 mg by mouth 3 (three) times daily. (Patient not taking: Reported on 07/04/2024), Disp: , Rfl:    zolpidem  (AMBIEN ) 10 MG tablet, Take 10 mg by mouth at bedtime. , Disp: , Rfl:   Social History   Tobacco Use  Smoking Status Former   Current packs/day: 0.00   Types: Cigarettes   Quit date: 1980   Years since quitting: 46.0  Smokeless Tobacco Never    No Known Allergies Objective:  There were no vitals filed for this visit. There is no height or weight on file to calculate BMI. Constitutional Well developed. Well nourished.  Vascular Dorsalis pedis pulses palpable bilaterally. Posterior tibial pulses palpable bilaterally. Capillary refill normal to all digits.  No cyanosis or clubbing noted. Pedal hair growth normal.  Neurologic Normal speech. Oriented to person, place, and time. Epicritic sensation to light touch grossly present bilaterally.  Dermatologic Nails well groomed and normal in appearance. No open wounds. No skin  lesions.  Orthopedic: Pain on palpation of left dorsal midfoot no pain at the Lisfranc interval.  No extensor tendinitis noted.  No pain at ankle joint noted.   Radiographs: None Assessment:   1. Arthritis of left midfoot     Plan:  Patient was evaluated and treated and all questions answered.  Left dorsal midfoot arthritis - All questions and concerns were discussed with the patient extensive detail given the amount of pain that he is experiencing benefit from a steroid injection of decreasing inflammatory components of shoulder pain.  Patient agrees with plan will to proceed with steroid injection -A steroid  injection was performed at right dorsal midfoot using 1% plain Lidocaine  and 10 mg of Kenalog. This was well tolerated.   No follow-ups on file.  "

## 2024-07-21 ENCOUNTER — Encounter: Admitting: Physician Assistant

## 2024-08-18 ENCOUNTER — Encounter: Admitting: Physician Assistant

## 2025-04-18 ENCOUNTER — Encounter: Admitting: Family Medicine
